# Patient Record
Sex: Male | Born: 1955
Health system: Southern US, Community
[De-identification: ages and names within clinical notes are randomized; demographics above are authoritative.]

## PROBLEM LIST (undated history)

## (undated) DIAGNOSIS — Z8249 Family history of ischemic heart disease and other diseases of the circulatory system: Secondary | ICD-10-CM

## (undated) DIAGNOSIS — E785 Hyperlipidemia, unspecified: Secondary | ICD-10-CM

## (undated) DIAGNOSIS — K759 Inflammatory liver disease, unspecified: Secondary | ICD-10-CM

## (undated) DIAGNOSIS — Z9289 Personal history of other medical treatment: Secondary | ICD-10-CM

## (undated) DIAGNOSIS — Z87898 Personal history of other specified conditions: Secondary | ICD-10-CM

## (undated) HISTORY — DX: Family history of ischemic heart disease and other diseases of the circulatory system: Z82.49

## (undated) HISTORY — DX: Personal history of other specified conditions: Z87.898

## (undated) HISTORY — PX: OTHER SURGICAL HISTORY: SHX169

## (undated) HISTORY — DX: Personal history of other medical treatment: Z92.89

## (undated) HISTORY — DX: Hyperlipidemia, unspecified: E78.5

## (undated) HISTORY — PX: TONSILLECTOMY: SUR1361

## (undated) HISTORY — DX: Inflammatory liver disease, unspecified: K75.9

---

## 1998-04-25 HISTORY — PX: NASAL SEPTOPLASTY W/ TURBINOPLASTY: SHX2070

## 1999-12-03 ENCOUNTER — Ambulatory Visit (HOSPITAL_BASED_OUTPATIENT_CLINIC_OR_DEPARTMENT_OTHER): Admission: RE | Admit: 1999-12-03 | Discharge: 1999-12-03 | Payer: Self-pay | Admitting: *Deleted

## 2004-06-10 ENCOUNTER — Ambulatory Visit: Payer: Self-pay | Admitting: Internal Medicine

## 2004-09-07 ENCOUNTER — Ambulatory Visit: Payer: Self-pay | Admitting: Internal Medicine

## 2004-09-14 ENCOUNTER — Ambulatory Visit (HOSPITAL_COMMUNITY): Admission: RE | Admit: 2004-09-14 | Discharge: 2004-09-14 | Payer: Self-pay | Admitting: Internal Medicine

## 2004-09-28 ENCOUNTER — Ambulatory Visit: Payer: Self-pay | Admitting: Internal Medicine

## 2004-10-13 ENCOUNTER — Ambulatory Visit: Payer: Self-pay | Admitting: Internal Medicine

## 2005-03-01 ENCOUNTER — Ambulatory Visit: Payer: Self-pay | Admitting: Internal Medicine

## 2005-07-11 HISTORY — PX: OTHER SURGICAL HISTORY: SHX169

## 2005-07-12 ENCOUNTER — Ambulatory Visit: Payer: Self-pay | Admitting: Internal Medicine

## 2005-08-17 ENCOUNTER — Encounter (INDEPENDENT_AMBULATORY_CARE_PROVIDER_SITE_OTHER): Payer: Self-pay | Admitting: Specialist

## 2005-08-17 ENCOUNTER — Ambulatory Visit (HOSPITAL_COMMUNITY): Admission: RE | Admit: 2005-08-17 | Discharge: 2005-08-17 | Payer: Self-pay | Admitting: Internal Medicine

## 2005-08-17 ENCOUNTER — Ambulatory Visit: Payer: Self-pay | Admitting: Internal Medicine

## 2005-10-10 ENCOUNTER — Ambulatory Visit: Payer: Self-pay | Admitting: Internal Medicine

## 2005-12-19 ENCOUNTER — Ambulatory Visit: Payer: Self-pay | Admitting: Internal Medicine

## 2006-05-18 ENCOUNTER — Ambulatory Visit: Payer: Self-pay | Admitting: Internal Medicine

## 2006-10-10 ENCOUNTER — Ambulatory Visit: Payer: Self-pay | Admitting: Internal Medicine

## 2006-10-11 ENCOUNTER — Ambulatory Visit: Payer: Self-pay | Admitting: Internal Medicine

## 2007-02-20 ENCOUNTER — Ambulatory Visit: Payer: Self-pay | Admitting: Internal Medicine

## 2007-07-06 ENCOUNTER — Ambulatory Visit: Payer: Self-pay | Admitting: Internal Medicine

## 2007-07-18 ENCOUNTER — Encounter: Payer: Self-pay | Admitting: Internal Medicine

## 2007-08-24 DIAGNOSIS — Z9289 Personal history of other medical treatment: Secondary | ICD-10-CM

## 2007-08-24 HISTORY — DX: Personal history of other medical treatment: Z92.89

## 2007-09-13 HISTORY — PX: TRANSTHORACIC ECHOCARDIOGRAM: SHX275

## 2007-10-15 DIAGNOSIS — J4520 Mild intermittent asthma, uncomplicated: Secondary | ICD-10-CM | POA: Insufficient documentation

## 2007-10-15 DIAGNOSIS — J3089 Other allergic rhinitis: Secondary | ICD-10-CM

## 2007-10-15 DIAGNOSIS — J302 Other seasonal allergic rhinitis: Secondary | ICD-10-CM | POA: Insufficient documentation

## 2007-10-16 ENCOUNTER — Ambulatory Visit: Payer: Self-pay | Admitting: Internal Medicine

## 2007-10-29 ENCOUNTER — Ambulatory Visit: Payer: Self-pay | Admitting: Internal Medicine

## 2007-10-31 ENCOUNTER — Ambulatory Visit: Payer: Self-pay | Admitting: Internal Medicine

## 2008-01-01 ENCOUNTER — Telehealth (INDEPENDENT_AMBULATORY_CARE_PROVIDER_SITE_OTHER): Payer: Self-pay | Admitting: *Deleted

## 2008-01-21 ENCOUNTER — Encounter: Payer: Self-pay | Admitting: Internal Medicine

## 2008-03-21 ENCOUNTER — Ambulatory Visit: Payer: Self-pay | Admitting: Internal Medicine

## 2008-04-28 ENCOUNTER — Ambulatory Visit: Payer: Self-pay | Admitting: Internal Medicine

## 2008-08-08 ENCOUNTER — Ambulatory Visit: Payer: Self-pay | Admitting: Internal Medicine

## 2009-01-07 ENCOUNTER — Ambulatory Visit: Payer: Self-pay | Admitting: Internal Medicine

## 2009-01-29 ENCOUNTER — Ambulatory Visit: Payer: Self-pay | Admitting: Internal Medicine

## 2009-05-10 ENCOUNTER — Encounter: Payer: Self-pay | Admitting: Internal Medicine

## 2009-05-21 ENCOUNTER — Ambulatory Visit: Payer: Self-pay | Admitting: Internal Medicine

## 2009-06-09 ENCOUNTER — Ambulatory Visit: Payer: Self-pay | Admitting: Internal Medicine

## 2009-09-30 ENCOUNTER — Ambulatory Visit: Payer: Self-pay | Admitting: Internal Medicine

## 2010-03-16 ENCOUNTER — Ambulatory Visit: Payer: Self-pay | Admitting: Internal Medicine

## 2010-05-20 ENCOUNTER — Ambulatory Visit
Admission: RE | Admit: 2010-05-20 | Discharge: 2010-05-20 | Payer: Self-pay | Source: Home / Self Care | Attending: Internal Medicine | Admitting: Internal Medicine

## 2010-05-25 NOTE — Assessment & Plan Note (Signed)
Summary: f/u ///kp   Primary Provider/Referring Provider:  Gareth Morgan  CC:  Follow up c/o increase nasal congestion has question about changing nasal spray.  History of Present Illness: ASTHMA (ICD-493.90) ALLERGIC RHINITIS (ICD-477.9)  10/29/07- 55 year old man returning for follow-up of allergic rhinitis and asthma.  Skin testing planned for today.  He would like a prescription for astepro nasal spray.  Skin test:Appropriate controls. Significant reactions especially for grass pollen, weeds, tree pollens, and dust mite.  04/28/08- Asthma, allergic rhinitis Doing well with vaccine, giving own with no problems. Not needing allegra currently. Got both flu vax. Mild chest congestion and very slow to clear winter colds, has been like this most of his life. Denies wheeze. Prone to left nare congestion with dry heat.  May 21, 2009- Asthma, allergic rhinitis He feels low grade nose and chest congestion with some nasal stuffiness. Hx of deviated septum surgery 10 years ago that helped. Tried off fexofenadine but got worse- it helps.     Current Medications (verified): 1)  Advair Diskus 250-50 Mcg/dose  Misc (Fluticasone-Salmeterol) .Marland Kitchen.. 1 Puff Two Times A Day 2)  Allegra 180 Mg  Tabs (Fexofenadine Hcl) .Marland Kitchen.. 1 By Mouth Daily 3)  Allergy Vaccine Go (W-E) 1:10 .... Advance To 1:10 Next Order 4)  Epipen 0.3 Mg/0.83ml (1:1000)  Devi (Epinephrine Hcl (Anaphylaxis)) .... As Needed 5)  Proair Hfa 108 (90 Base) Mcg/act  Aers (Albuterol Sulfate) .... 2 Puffs Four Times A Day As Needed 6)  Astepro 137 Mcg/spray  Soln (Azelastine Hcl) .Marland Kitchen.. 1-2 Spray Each Nostril Up To Twice Daily 7)  Adult Aspirin Ec Low Strength 81 Mg  Tbec (Aspirin) .Marland Kitchen.. 1 By Mouth Daily 8)  Simvastatin .Marland Kitchen.. 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    Vital Signs:  Patient profile:   55 year old male Height:      69 inches Weight:      154 pounds BMI:     22.82 O2 Sat:      97 % on Room air Pulse rate:   91 / minute BP sitting:   116 / 72  (right arm) Cuff size:   regular  Vitals Entered By: Renold Genta RCP, LPN (May 21, 2009 3:13 PM)  O2 Sat at Rest %:  97% O2 Flow:  Room air CC: Follow up c/o increase nasal congestion has question about changing nasal spray Comments Medications reviewed with patient Renold Genta RCP, LPN  May 21, 2009 3:24 PM    Physical Exam  Additional Exam:  General: A/Ox3; pleasant and cooperative, NAD, slender SKIN: no rash, lesions NODES: no lymphadenopathy HEENT: [place]/AT, EOM- WNL, Conjuctivae- clear, PERRLA, TM-WNL, Nose- clear, external nasal deviation to the right, Throat- red w/o exudate or drainage NECK: Supple w/ fair ROM, JVD- none,  normal carotid impulses w/o bruits Thyroid- normal to palpation CHEST: Clear to P&A HEART: RRR, no m/g/r heard ABDOMEN: Soft and nl;  ZOX:WRUE, nl pulses, no edema  NEURO: Grossly intact to observation      Impression & Recommendations:  Problem # 1:  ALLERGIC RHINITIS (ICD-477.9)  Astepro helps some. Nasonex and Singulair didn't help much. We introduced Neti pot concept. His  updated medication list for this problem includes:    Allegra 180 Mg Tabs (Fexofenadine hcl) .Marland Kitchen... 1 by mouth daily    Astepro 137 Mcg/spray Soln (Azelastine hcl) .Marland Kitchen... 1-2 spray each nostril up to twice daily  Problem # 2:  ASTHMA (ICD-493.90) We will try substituting Symbicort 160/4.5 for Advair for awhile. Watch for reflux- this may be causing more irritation than he realizes.  Medications Added to Medication List This Visit: 1)  Simvastatin  .Marland Kitchen.. 1 once daily  Other Orders: Est. Patient Level III (45409)  Patient Instructions: 1)  Schedule return in one year, earlier if needed 2)  Continue allergy vaccine at 1:10 3)  Fexofenadine is renewed 4)  Try the Neti pot/ Squeeze bottle approach to rinse your nasal passages. 5)  Consider trying otc nasal anti-inflammatory Nasalcrom/ cromolyn nasal spray. 6)  Try replacing Advair for a while with sample Symbicort 160/4.5 :  7)   2 puffs and rinse twice daily. 8)  Pay attention to how often you feel stomach juice reflux or acid indigestion. It may help your throat to take otc pepcid once daily.

## 2010-05-25 NOTE — Medication Information (Signed)
Summary: Advair dispensed via mail/[employer]  Advair dispensed via mail/[employer]   Imported By: Lester [place] 02/06/2008 10:04:42  _____________________________________________________________________  External Attachment:    Type:   Image     Comment:   External Document

## 2010-05-25 NOTE — Progress Notes (Signed)
Summary: clarify rx  Phone Note From Pharmacy Call back at [phone]    Caller: [facility] Call For: Kevin [name]  Summary of Call: clarify rx given on 09/02 - dosge, direction refills  INV# [id number] Initial call taken by: Eugene Gavia,  January 01, 2008 1:55 PM  Follow-up for Phone Call        [facility] wanted Kevin Harvey. to refill fexofenadine - gave it for 3 month supply. Marinus Maw  January 01, 2008 5:03 PM       Prescriptions: ALLEGRA 180 MG  TABS (FEXOFENADINE HCL) 1 by mouth daily  #90 x 3   Entered by:   Marinus Maw   Authorized by:   Pulmonary Triage   Signed by:   Marinus Maw on 01/01/2008   Method used:   Telephoned to ...       [facility]* (mail-order)             ,          Ph: [phone]       Fax: [phone]   RxID:   [id number]

## 2010-05-25 NOTE — Assessment & Plan Note (Signed)
Summary: rov 6 months////kp   PCP:  Gareth Morgan  Chief Complaint:  6 month follow up visit .  History of Present Illness: Current Problems:  ASTHMA (ICD-493.90) ALLERGIC RHINITIS (ICD-477.9)  10/29/07- 55 year old man returning for follow-up of allergic rhinitis and asthma.  Skin testing planned for today.  He would like a prescription for astepro nasal spray.  Skin test:Appropriate controls. Significant reactions especially for grass pollen, weeds, tree pollens, and dust mite.  04/28/08- Asthma, allergic rhinitis Doing well with vaccine, giving own with no problems. Not needing allegra currently. Got both flu vax. Mild chest congestion and very slow to clear winter colds, has been like this most of his life. Denies wheeze. Prone to left nare congestion with dry heat.         Prior Medication List:  ADVAIR DISKUS 250-50 MCG/DOSE  MISC (FLUTICASONE-SALMETEROL) 1 puff two times a day ALLEGRA 180 MG  TABS (FEXOFENADINE HCL) 1 by mouth daily * ALLERGY VACCINE  EPIPEN 0.3 MG/0.3ML (1:1000)  DEVI (EPINEPHRINE HCL (ANAPHYLAXIS)) as needed PROAIR HFA 108 (90 BASE) MCG/ACT  AERS (ALBUTEROL SULFATE) 2 puffs four times a day as needed ASTEPRO 137 MCG/SPRAY  SOLN (AZELASTINE HCL) 1-2 spray each nostril up to twice daily VYTORIN 10-20 MG  TABS (EZETIMIBE-SIMVASTATIN) 1 by mouth daily ADULT ASPIRIN EC LOW STRENGTH 81 MG  TBEC (ASPIRIN) 1 by mouth daily   Updated Prior Medication List: ADVAIR DISKUS 250-50 MCG/DOSE  MISC (FLUTICASONE-SALMETEROL) 1 puff two times a day ALLEGRA 180 MG  TABS (FEXOFENADINE HCL) 1 by mouth daily VYTORIN 10-20 MG  TABS (EZETIMIBE-SIMVASTATIN) 1 by mouth daily ADULT ASPIRIN EC LOW STRENGTH 81 MG  TBEC (ASPIRIN) 1 by mouth daily * ALLERGY VACCINE  EPIPEN 0.3 MG/0.3ML (1:1000)  DEVI (EPINEPHRINE HCL (ANAPHYLAXIS)) as needed PROAIR HFA 108 (90 BASE) MCG/ACT  AERS (ALBUTEROL SULFATE) 2 puffs four times a day as needed ASTEPRO 137 MCG/SPRAY  SOLN (AZELASTINE HCL)  1-2 spray each nostril up to twice daily  Current Allergies (reviewed today): No known allergies   Past Medical History:    Reviewed history from 10/15/2007 and no changes required:       Allergic Rhinitis       Asthma       Hepatitis in 3rd grade  Past Surgical History:    nasal septoplasty    tonsils    varicocoel   Social History:    Reviewed history from 10/16/2007 and no changes required:       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       Never smoked or ETOH use       Married with 2 children    Review of Systems      See HPI   Vital Signs:  Patient Profile:   55 Years Old Male Weight:      152.13 pounds O2 Sat:      98 % O2 treatment:    Room Air Pulse rate:   85 / minute BP sitting:   106 / 68  (left arm) Cuff size:   regular  Vitals Entered By: Reynaldo Minium CMA (April 28, 2008 4:26 PM)             Comments Medications reviewed with patient Reynaldo Minium CMA  April 28, 2008 4:27 PM      Physical Exam  General: A/Ox3; pleasant and cooperative, NAD, SKIN: no rash, lesions NODES: no lymphadenopathy HEENT: [place]/AT, EOM- WNL, Conjuctivae- clear, PERRLA, TM-WNL, Nose- clear, external nasal deviation to the right, Throat- clear  and wnl NECK: Supple w/ fair ROM, JVD- none, normal carotid impulses w/o bruits Thyroid- normal to palpation CHEST: Clear to P&A HEART: RRR, no m/g/r heard ABDOMEN: Soft and nl; nml bowel sounds; no organomegaly or masses noted EAV:WUJW, nl pulses, no edema  NEURO: Grossly intact to observation         Problem # 1:  ALLERGIC RHINITIS (ICD-477.9) Good control now. Allergy vaccine is well tolerated. His updated medication list for this problem includes:    Allegra 180 Mg Tabs (Fexofenadine hcl) .Marland Kitchen... 1 by mouth daily    Astepro 137 Mcg/spray Soln (Azelastine hcl) .Marland Kitchen... 1-2 spray each nostril up to twice daily   Problem # 2:  ASTHMA (ICD-493.90) Satisfactory now. has avoided winter colds so far. His updated medication list for this  problem includes:    Advair Diskus 250-50 Mcg/dose Misc (Fluticasone-salmeterol) .Marland Kitchen... 1 puff two times a day    Proair Hfa 108 (90 Base) Mcg/act Aers (Albuterol sulfate) .Marland Kitchen... 2 puffs four times a day as needed   Medications Added to Medication List This Visit: 1)  Allergy Vaccine Go (w-e)  .... Advance to 1:10 next order   Patient Instructions: 1)  Please schedule a follow-up appointment in 1 year. 2)  continue allergy vaccine- We will advance vaccine strength to 1:10 next time you order. 3)  Try otc nasal saline gel fot the dry nose as needed.   Prescriptions: ALLERGY VACCINE GO (W-E) Advance to 1:10 NEXT ORDER  #1 x prn   Entered and Authorized by:   Waymon Budge MD   Signed by:   Waymon Budge MD on 05/10/2008   Method used:   Print then Give to Patient   RxID:   [id number]  ]

## 2010-05-25 NOTE — Assessment & Plan Note (Signed)
Summary: FLU SHOT/[facility]  Nurse Visit   Allergies: No Known Drug Allergies  Orders Added: 1)  Admin 1st Vaccine [90471] 2)  Flu Vaccine 75yrs + [09811] Flu Vaccine Consent Questions     Do you have a history of severe allergic reactions to this vaccine? no    Any prior history of allergic reactions to egg and/or gelatin? no    Do you have a sensitivity to the preservative Thimersol? no    Do you have a past history of Guillan-Barre Syndrome? no    Do you currently have an acute febrile illness? no    Have you ever had a severe reaction to latex? no    Vaccine information given and explained to patient? yes    Are you currently pregnant? no    Lot Number:[id number]   Exp Date:10/22/2009   Site Given  Right Deltoid IM   Tammy Scott  January 29, 2009 10:50 AM

## 2010-05-25 NOTE — Assessment & Plan Note (Signed)
Summary: 1 yr rov////kp   Visit Type:  Follow-up PCP:  Gareth Morgan  Chief Complaint:  yearly follow up visit.  History of Present Illness: Current Problems:  ASTHMA (ICD-493.90) ALLERGIC RHINITIS (ICD-477.9)  55 yo man returns for follow up of allergic rhinitis and asthma. He continues giving his own allergy vaccine at 1:10 with no problems.  He had his wife had a cough with chest congestion for 3 weeks, now resolving.  He complains of the humidity and complains of working in an old building.  He is sensitive tothe odor of newsprint and newspaper.  He snores and his wife is in a separate room.  A sleep study years ago found him borderline for diagnosis of sleep apnea.  The methacholine inhalation challenge test was negative in 2006. Denies headache, sinus drainage, sneezing chest pain, dyspnea, n/v/d, weight loss, fever, edema.         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       Never smoked or ETOH use       Married with 2 children    Review of Systems      See HPI   Vital Signs:  Patient Profile:   55 Years Old Male Weight:      156 pounds O2 Sat:      97 % O2 treatment:    Room Air Pulse rate:   94 / minute BP sitting:   106 / 60  (right arm) Cuff size:   regular  Vitals Entered By: Reynaldo Minium CMA (October 16, 2007 2:02 PM)             Comments Medications reviewed with patient Reynaldo Minium CMA  October 16, 2007 2:02 PM      Physical Exam  GENERAL:  A/Ox3; pleasant & cooperative.NAD, lean HEENT:  [place]/AT,  EOM-wnl, PERRLA, EACs-clear, TMs-wnl, NOSE-externalnose and septum are deviated., THROAT-clear & wnl. NECK:  Supple w/ fair ROM; no JVD; normal carotid impulses w/o bruits; no thyromegaly or nodules palpated; no lymphadenopathy. CHEST: Clear to P&A HEART:  RRR, no m/r/g  heard ABDOMEN:  Soft & nt; nml bowel sounds; no organomegaly or masses detected. EXT: Warm bilat,  no calf pain, edema, clubbing, pulses intact Skin: no rash/lesion       Problem # 1:  ASTHMA (ICD-493.90) recent asthma exacerbation, shared with his wife sounds like a viral bronchitis syndrome.  Snoring reflects old traumatic deviation of his nose.  He does want clarification of his allergy sensitivity status andI  agreed to bring him back for retesting.  I offered referral for ENT evaluation to repair his nasal anatomy, wants to wait.  Plan: Allergy retest, sample astepro. His updated medication list for this problem includes:    Advair Diskus 250-50 Mcg/dose Misc (Fluticasone-salmeterol) .Marland Kitchen... 1 puff two times a day    Proair Hfa 108 (90 Base) Mcg/act Aers (Albuterol sulfate) .Marland Kitchen... 2 puffs four times a day as needed  Problem # 2:  ALLERGIC RHINITIS (ICD-477.9) plan allergy retest and sample astepro as discussed above.  We reviewed environmental precautions again. His updated medication list for this problem includes:    Allegra 180 Mg Tabs (Fexofenadine hcl) .Marland Kitchen... 1 by mouth daily   Medications Added to Medication List This Visit: 1)  Proair Hfa 108 (90 Base) Mcg/act Aers (Albuterol sulfate) .... 2 puffs four times a day as needed   Patient Instructions: 1)  Return as able for allergy skin testing- 2)  Need to be off antihistamines, including Allegra and the sample of Astepro nasal spray, for 3 days before you return.   Prescriptions: PROAIR HFA 108 (90 BASE) MCG/ACT  AERS (ALBUTEROL SULFATE) 2 puffs four times a day as needed  #3 x 3   Entered and Authorized by:   Waymon Budge MD   Signed by:   Waymon Budge MD on 10/16/2007   Method used:   Print then Give to Patient   RxID:   [id number] ADVAIR DISKUS 250-50 MCG/DOSE  MISC (FLUTICASONE-SALMETEROL) 1 puff two times a day  #3 x 3   Entered and Authorized by:   Waymon Budge MD   Signed by:   Waymon Budge MD on 10/16/2007   Method used:   Print then Give to Patient   RxID:   [id number]  ]

## 2010-05-25 NOTE — Medication Information (Signed)
Summary: ProAir/[facility]  [facility]Air/[facility]   Imported By: Sherian Rein 05/13/2009 12:03:28  _____________________________________________________________________  External Attachment:    Type:   Image     Comment:   External Document

## 2010-05-25 NOTE — Assessment & Plan Note (Signed)
Summary: ALLERGY TEST/[facility]   Vital Signs:  Patient Profile:   55 Years Old Male Weight:      153.38 pounds O2 Sat:      99 % O2 treatment:    Room Air Pulse rate:   77 / minute BP sitting:   110 / 66  (left arm) Cuff size:   regular  Vitals Entered By: Kevin [name] CMA (October 29, 2007 3:17 PM)                 PCP:  Kevin [name]  Chief Complaint:  allergy testing.  History of Present Illness: Current Problems:  ASTHMA (ICD-493.90) ALLERGIC RHINITIS (ICD-57.43)  55 year old man returning for follow-up of allergic rhinitis and asthma.  Skin testing planned for today.  He would like a prescription for astepro nasal spray.  Skin test:Appropriate controls. Significant reactions especially for grass pollen, weeds, tree pollens, and dust mite.      Prior Medications Reviewed Using: Patient Recall  Updated Prior Medication List: ADVAIR DISKUS 250-50 MCG/DOSE  MISC (FLUTICASONE-SALMETEROL) 1 puff two times a day ALLEGRA 180 MG  TABS (FEXOFENADINE HCL) 1 by mouth daily VYTORIN 10-20 MG  TABS (EZETIMIBE-SIMVASTATIN) 1 by mouth daily ADULT ASPIRIN EC LOW STRENGTH 81 MG  TBEC (ASPIRIN) 1 by mouth daily * ALLERGY VACCINE  EPIPEN 0.3 MG/0.3ML (1:1000)  DEVI (EPINEPHRINE HCL (ANAPHYLAXIS)) as needed PROAIR HFA 108 (90 BASE) MCG/ACT  AERS (ALBUTEROL SULFATE) 2 puffs four times a day as needed ASTEPRO 137 MCG/SPRAY  SOLN (AZELASTINE HCL) 1-2 spray each nostril up to twice daily  Current Allergies (reviewed today): No known allergies   Past Medical History:    Reviewed history from 10/15/2007 and no changes required:       Allergic Rhinitis       Asthma   Social History:    Reviewed history from 10/16/2007 and no changes required:       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     Physical Exam  GENERAL:   A/Ox3; pleasant & cooperative.NAD, thin HEENT:  [place]/AT, EOM-wnl, PERRLA, EACs-clear, TMs-wnl, NOSE-clear, THROAT-clear & wnl. NECK:  Supple w/ fair ROM; no JVD; normal carotid impulses w/o bruits; no thyromegaly or nodules palpated; no lymphadenopathy. CHEST: Clear to P&A HEART:  RRR, no m/r/g  heard ABDOMEN:  Soft & nt; nml bowel sounds; no organomegaly or masses detected. EXT: Warm bilat,  no calf pain, edema, clubbing, pulses intact Skin: no rash/lesion      Problem # 1:  ASTHMA (ICD-493.90) allergic rhinitis and asthma.  Current skin test profile is not significantly different from his vaccine so we are not going to change.  He will continue at 1:10.  Education discussion again about symptomatic therapy, and environmental precautions, as well as risk-benefit considerations of allergy vaccine.  Medications Added to Medication List This Visit: 1)  Astepro 137 Mcg/spray Soln (Azelastine hcl) .Marland Kitchen.. 1-2 spray each nostril up to twice daily   Patient Instructions: 1)  Please schedule a follow-up appointment in 6 months.   Prescriptions: ASTEPRO 137 MCG/SPRAY  SOLN (AZELASTINE HCL) 1-2 spray each nostril up to twice daily  #3 x 3   Entered by:   Kevin [name] CMA   Authorized by:  Kevin Budge MD   Signed by:   Kevin [name] CMA on 10/29/2007   Method used:   Print then Give to Patient   RxID:   [id number]  ]

## 2010-05-25 NOTE — Miscellaneous (Signed)
Summary: Routine Allergy Skin Test/[facility]  Routine Allergy Skin Test/[facility]   Imported By: Esmeralda Links D'jimraou 11/05/2007 13:25:39  _____________________________________________________________________  External Attachment:    Type:   Image     Comment:   External Document

## 2010-05-25 NOTE — Medication Information (Signed)
Summary: Refill Advair Diskus/[facility]  Refill Advair Diskus/[facility]   Imported By: Maryln Gottron 07/20/2007 11:33:24  _____________________________________________________________________  External Attachment:    Type:   Image     Comment:   External Document

## 2010-05-25 NOTE — Medication Information (Signed)
Summary: Fexofenadine/[facility]  Fexofenadine/[facility]   Imported By: Sherian Rein 05/26/2009 07:50:22  _____________________________________________________________________  External Attachment:    Type:   Image     Comment:   External Document

## 2010-06-02 NOTE — Assessment & Plan Note (Signed)
Summary: rov 1 yr ///kp   Primary Provider/Referring Provider:  Gareth Morgan   History of Present Illness: 10/29/07- 55 year old man returning for follow-up of allergic rhinitis and asthma.  Skin testing planned for today.  He would like a prescription for astepro nasal spray.  Skin test:Appropriate controls. Significant reactions especially for grass pollen, weeds, tree pollens, and dust mite.  04/28/08- Asthma, allergic rhinitis Doing well with vaccine, giving own with no problems. Not needing allegra currently. Got both flu vax. Mild chest congestion and very slow to clear winter colds, has been like this most of his life. Denies wheeze. Prone to left nare congestion with dry heat.  May 21, 2009- Asthma, allergic rhinitis He feels low grade nose and chest congestion with some nasal stuffiness. Hx of deviated septum surgery 10 years ago that helped. Tried off fexofenadine but got worse- it helps.   May 20, 2010-  Asthma, allergic rhinitis Acute bronchitis- not much wheeze. Moisute in his office building makes him hoarse. Had cold 2 weeks ago. No throat clearing but not sore throat or discolored phlegm. Left side of nose routinely stops up easily despite remote hx of septoplasty. He continues allergy vaccine at 1:10, believing it helps, with no problems. Now uses hearing aides but denies ear pain or pressure.  Discussed Neti pot. Using sudafed +/- allegra    Preventive Screening-Counseling & Management  Alcohol-Tobacco     Smoking Status: never  Allergies: No Known Drug Allergies  Past History:  Past Medical History: Last updated: 04/28/2008 Allergic Rhinitis Asthma Hepatitis in 3rd grade  Past Surgical History: Last updated: 04/28/2008 nasal septoplasty tonsils varicocoel  Family History: Last updated: 10/16/2007 Mother-living age 58; no medical problems Father- deceased age 38; CHF, Influenza 2 Brothers-living age 30 and 96; heart attack for  both Sister- living age 69; no medical problems  Social History: Last updated: 10/16/2007 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  The patient denies shortness of breath with activity, shortness of breath at rest, productive cough, coughing up blood, chest pain, irregular heartbeats, acid heartburn, indigestion, loss of appetite, weight change, abdominal pain, difficulty swallowing, sore throat, tooth/dental problems, headaches, sneezing, change in color of mucus, and fever.    Physical Exam  Additional Exam:  General: A/Ox3; pleasant and cooperative, NAD, slender,throat clearing SKIN: no rash, lesions NODES: no lymphadenopathy HEENT: [place]/AT, EOM- WNL, Conjuctivae- clear, PERRLA, TM-WNL, Nose- clear, external nasal deviation to the right, Throat- red w/o exudate or drainage NECK: Supple w/ fair ROM, JVD- none, normal carotid impulses w/o bruits Thyroid- normal to palpation CHEST: Clear to P&A HEART: RRR, no m/g/r heard ABDOMEN: Soft and nl;  VWU:JWJX, nl pulses, no edema  NEURO: Grossly intact to observation      Impression & Recommendations:  Problem # 1:  ALLERGIC RHINITIS (ICD-477.9)  Adequate long term control. We reviewed allergy vaccine status and adjustments available as Spring season starts.  Some concern about what he thinks is a sick office building with moisture problem.  Now has an incidental URI and we discussed viral vs allergic patterns. neti pot would be worth a try. He chosses to wait on taking any more meds.  His updated medication list for this problem includes:  Allegra 180 Mg Tabs (Fexofenadine hcl) .Marland Kitchen... 1 by mouth daily    Astepro 137 Mcg/spray Soln (Azelastine hcl) .Marland Kitchen... 1-2 spray each  nostril up to twice daily  Problem # 2:  ASTHMA (ICD-493.90) Minimal intermittent astma, well enough controlled.   Other Orders: Est. Patient Level III (16109)  Patient Instructions: 1)  Please schedule a follow-up appointment in 1 year. 2)  Consider trying a Neti pot for nasal congestion 3)  Continue allergy vaccine at 1:10

## 2010-06-08 ENCOUNTER — Telehealth: Payer: Self-pay | Admitting: Internal Medicine

## 2010-06-16 NOTE — Progress Notes (Signed)
  Phone Note Refill Request Message from:  Fax from Pharmacy  Refills Requested: Medication #1:  PROAIR HFA 108 (90 BASE) MCG/ACT  AERS 2 puffs four times a day as needed Initial call taken by: Zackery Barefoot CMA,  June 08, 2010 9:20 AM    Prescriptions: PROAIR HFA 108 (90 BASE) MCG/ACT  AERS (ALBUTEROL SULFATE) 2 puffs four times a day as needed  #3 x 3   Entered by:   Zackery Barefoot CMA   Authorized by:   Waymon Budge MD   Signed by:   Zackery Barefoot CMA on 06/08/2010   Method used:   Faxed to ...       [facility] (mail-order)       [address] [place], [place]  [address]       Ph: [phone]       Fax: [phone]   RxID:   [id number]

## 2010-08-12 ENCOUNTER — Ambulatory Visit (INDEPENDENT_AMBULATORY_CARE_PROVIDER_SITE_OTHER): Payer: Federal, State, Local not specified - PPO

## 2010-08-12 DIAGNOSIS — J309 Allergic rhinitis, unspecified: Secondary | ICD-10-CM

## 2010-09-07 NOTE — Assessment & Plan Note (Signed)
[facility]                             PULMONARY OFFICE NOTE   NAME:Kevin [name], Kevin [name]                        MRN:          [id number]  DATE:10/10/2006                            DOB:          Aug 08, 1955    PROBLEM:  1. Asthma.  2. Allergic rhinitis.   HISTORY:  He has done quite well.  He stopped Singulair because he saw  no benefit.  The orange air quality days definitely bother him, but he  has been otherwise okay through the spring.  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  He is not much bothered  by nasal congestion lying down on most days and has been using a nasal  saline spray.  There had been some question of sleep-disordered  breathing and he had a sleep study at [facility],  which he understands showed no significant problems.   MEDICATIONS:  1. Advair 250/50.  2. Allegra 180.  3. Allergy vaccine at 1:10 with no problems.  4. Vytorin 10/20.  5. Aspirin 81 mg.  6. P.r.n. use of an albuterol inhaler.  7. He has an EpiPen.   ALLERGIES:  No medication allergy.   OBJECTIVE:  Weight 151 pounds, blood pressure 116/70, pulse 85, room air  saturation 98%.  Mild hoarseness.  There is external deviation of his nose and septum  towards the right, although he gives history of a septoplasty.  Tympanic  membranes are normal.  Voice quality is normal with no postnasal drip or  stridor.  LUNGS:  Clear.  HEART:  Sounds normal.   IMPRESSION:  1. Asthmatic bronchitis with history of negative methacholine study.  2. Rhinitis.   PLAN:  1. We discussed saline gel, nasal strips and Nasalcrom nose spray.  2. We refilled his EpiPen and his inhaled medications.  3. Scheduled return in 1 year, earlier p.r.n.  4. Continue allergy vaccine at 1:10.     Clinton D. Maple Hudson, MD, Tonny Bollman, FACP  Electronically Signed    CDY/MedQ  DD:  10/10/2006  DT: 10/11/2006  Job #: [id number]   cc:   Mila Homer. Sudie Bailey, M.D.

## 2010-09-10 NOTE — Op Note (Signed)
NAME:  Kevin [name], Kevin [name]                 ACCOUNT NO.:  [id number]   MEDICAL RECORD NO.:  [id number]          PATIENT TYPE:  AMB   LOCATION:  DAY                           FACILITY:  [facility]   PHYSICIAN:  R. Roetta Sessions, M.D. DATE OF BIRTH:  02-02-1956   DATE OF PROCEDURE:  08/17/2005  DATE OF DISCHARGE:                                 OPERATIVE REPORT   PROCEDURE:  Colonoscopy with snare polypectomy.   INDICATIONS FOR PROCEDURE:  The patient is a 55 year old gentleman devoid of  any lower GI tract symptoms, sent under the courtesy of Dr. __________ for  colorectal cancer screening.  He has never had his lower GI tract imaged.  There is no family history of colorectal neoplasia.  Colonoscopy is now to  be done. The proposed test was discussed with patient including the  potential risks, benefits and alternatives of the exam, and the questions  answered.  Please see documentation in medical record procedure note O2  saturation, blood pressure, pulse, respirations monitored throughout the  entire procedure.   CONSCIOUS SEDATION:  Versed 3 mg IV, Demerol 75 mg IV in divided doses.  Because the patient states he does take antibiotics for his heart with  invasive procedures, we gave him ampicillin 2 grams IV and gentamicin 105 mg  IV prior to the procedure.   INSTRUMENTATION:  Olympus video chip system.   FINDINGS:  Digital rectal exam revealed no abnormalities.   ENDOSCOPIC FINDINGS:  Prep was good.  Rectum and colon were examined  including rectum via retroflexion to the see the anal verge revealed some  internal hemorrhoids and a 1.5 x 1 cm flat polyp that came right down to the  anal verge.  Please see photos.  The rectal mucosa otherwise appeared  normal.  The colonic mucosa was surveyed from the rectosigmoid junction to  the left transverse right colon, identifying the appendiceal orifice,  ileocecal valve and cecum.  These structures were well seen and photographed  for the record.   The Olympus endoscope was slowly withdrawn and all  previously viewed surfaces were again seen.  The patient was noted to have  scattered left- sided diverticula and colonic mucosa appeared normal.  The  rectal polyp was removed with two passes of the snare in the retroflexed  position.  The polyp was recovered.  The patient tolerated the procedure  well and was reactive in endoscopy suite.   ENDOSCOPIC IMPRESSIONS:  Internal hemorrhoids, flat polyp in distal rectum,  resected as described above, otherwise normal rectum.  Left sided  diverticula.  The remainder of colonic mucosa appeared normal.   RECOMMENDATIONS:  1.  Diverticulosis literature provided to Mr. Smelcer.  2.  No aspirin or arthritis medications for 10 days.  3.  Follow up on pathology.  4.  Further recommendations to follow.      Jonathon Bellows, M.D.  Electronically Signed     RMR/MEDQ  D:  08/17/2005  T:  08/18/2005  Job:  [id number]   cc:   Dr. Brett Canales __________

## 2010-09-10 NOTE — Op Note (Signed)
[facility]  Patient:    Kevin [name], Kevin [name]                        MRN: [id number] Proc. Date: 12/03/99 Adm. Date:  [id number] Attending:  Aundria Mems                           Operative Report  PREOPERATIVE DIAGNOSES: 1. Deviated nasal septum. 2. Hyperplastic inferior nasal turbinates.  POSTOPERATIVE DIAGNOSES: 1. Deviated nasal septum. 2. Hyperplastic inferior nasal turbinates.  PROCEDURE: 1. Nasal septoplasty. 2. Submucous resection bilateral inferior nasal turbinates.  DESCRIPTION OF PROCEDURE:  With the patient under general endotracheal anesthesia the face was prepped and draped in a sterile fashion. Examination of the nose revealed that the patient had an anterior maxillary crest spur of mild to moderate degrees towards the left and progressing posteriorly into a very sharp prominent vomerine spur abutting into the lateral nasal wall. The patient had prominent middle and inferior turbinates right somewhat more so than left in a compensatory fashion.  Nasal block anesthesia was applied with olive tipped probes soaked with 4% Xylocaine with ephedrine solution applied to the sphenopalatine and anterior ethmoid nerve areas bilaterally.  The nasal columella and septum was infiltrated with 1% Xylocaine with 1:100,000 epinephrine.  Nasal cotton pledgets were placed bilaterally also soaked in 4% Xylocaine with 1:100,000 epinephrine.  The inferior turbinates were infiltrated later in the procedure with 1% Xylocaine with 1:100,000 epinephrine all for vasocontriction.  A caudal incision was made inside the left nasal vestibule.  Mucosal flap was elevated off the cartilaginous bony septum on the left side encountering the expected abnormalities of deflection of the inferior bilateral cartilage and maxillary crest to the left and far more posteriorly the large prominent 4+ abutting vomerine spur to the left.  The flaps were elevated off the  patients right septum as well.  The posteroinferior quadrilateral cartilage, maxillary crest and vomerine spur were all removed giving visible airway into the nasal pharynx into the left side.  Once this was completed he had moderate deflection of the perpendicular ethmoid plate so the quadrilateral cartilage was separated from the perpendicular ethmoid plate posterosuperiorly and the deflected ethmoid plate removed.  This brought the patients septum into a straight midline position anterior to posterior with clear visible airways. He had large prominent inferior turbinates and middle turbinates bilaterally.  A stab incision was made at the anterior aspect of the left inferior turbinate, superiorly based septal surface mucosal flap was elevated off the large heavy turbinate bone.  The lower 2/3rds of the turbinate bone with attached inferior meatal and free margin mucosa was sharply excised with angled scissors removing the majority of the posterior extent of the turbinate.  Hemostasis was obtained with suction cautery along the bony and mucosal margins.  The remaining septal bone was outfractured.  The prominent and middle turbinate on the left side was gently outfractured.  The right inferior turbinate was reduced in a similar fashion.  The right middle turbinate being quite prominent was crushed probably containing a conchal bullosa cell and gently outfractured.  The septal incision was then closed with interrupted sutures of 4-0 chromic cat gut sutures.  The patients nose was then packed with Vaseline gauze impregnated with Cortisporin ointment inserting a 6.5 French nasal trumpet airways bilaterally at the patients request.  The nose was packed completely and carefully around the anterior aspect of the  nasal trumpets protecting any contact from the nasal vestibular skin.  The nasal trumpets were also stabilized in position with 3-0 ______-0 chromic cat gut suture anteriorly.  The  patients oropharynx and nasopharynx was suctioned clear at the end of the case.  Blood loss in a suction catheter significantly less than 100 cc at the completion of the case.  The patient tolerated the procedure well and was taken to recovery area in stable general condition. DD:  12/03/99 TD:  12/04/99 Job: [id number]

## 2010-11-29 ENCOUNTER — Ambulatory Visit (INDEPENDENT_AMBULATORY_CARE_PROVIDER_SITE_OTHER): Payer: Self-pay

## 2010-11-29 DIAGNOSIS — J309 Allergic rhinitis, unspecified: Secondary | ICD-10-CM

## 2010-12-21 ENCOUNTER — Other Ambulatory Visit: Payer: Self-pay | Admitting: Internal Medicine

## 2010-12-21 MED ORDER — FLUTICASONE-SALMETEROL 250-50 MCG/DOSE IN AEPB: 1.0000 | INHALATION_SPRAY | Freq: Two times a day (BID) | RESPIRATORY_TRACT | Status: DC

## 2011-01-12 ENCOUNTER — Other Ambulatory Visit: Payer: Self-pay | Admitting: *Deleted

## 2011-01-12 MED ORDER — FLUTICASONE-SALMETEROL 250-50 MCG/DOSE IN AEPB: 1.0000 | INHALATION_SPRAY | Freq: Two times a day (BID) | RESPIRATORY_TRACT | Status: DC

## 2011-05-20 ENCOUNTER — Encounter: Payer: Self-pay | Admitting: Internal Medicine

## 2011-05-23 ENCOUNTER — Encounter: Payer: Self-pay | Admitting: Internal Medicine

## 2011-05-23 ENCOUNTER — Ambulatory Visit (INDEPENDENT_AMBULATORY_CARE_PROVIDER_SITE_OTHER): Payer: Federal, State, Local not specified - PPO | Admitting: Internal Medicine

## 2011-05-23 VITALS — BP 110/72 | HR 95 | Ht 69.0 in | Wt 150.6 lb

## 2011-05-23 DIAGNOSIS — J45909 Unspecified asthma, uncomplicated: Secondary | ICD-10-CM

## 2011-05-23 DIAGNOSIS — J309 Allergic rhinitis, unspecified: Secondary | ICD-10-CM

## 2011-05-23 MED ORDER — EPINEPHRINE 0.3 MG/0.3ML IJ DEVI: INTRAMUSCULAR | Status: AC

## 2011-05-23 NOTE — Assessment & Plan Note (Signed)
He has done well with allergy vaccine. Reminds me of history of septoplasty. He does not remember trauma although his nose is externally deviated to the right without obvious obstruction. He has an incidental cold today which should clear without treatment.

## 2011-05-23 NOTE — Progress Notes (Signed)
05/23/11- 55 yoM  Never smoker followed for asthma, allergic rhinitis LOV- 05/20/10 He recently took a retirement package offer. 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 He has noticed that on vacations, away from that building, nasal congestion would improve. He continues allergy vaccine without problems. Has had flu and pneumococcal vaccines. Now has an incidental cold with clear mucus, some cough, treated OTC.  ROS-see HPI Constitutional:   No-   weight loss, night sweats, fevers, chills, fatigue, lassitude. HEENT:   No-  headaches, difficulty swallowing, tooth/dental problems, sore throat,       No-  sneezing, itching, ear ache,+ nasal congestion, post nasal drip,  CV:  No-   chest pain, orthopnea, PND, swelling in lower extremities, anasarca,  dizziness, palpitations Resp: No-   shortness of breath with exertion or at rest.              +  productive cough,  No non-productive cough,  No- coughing up of blood.              No-   change in color of mucus.  No- wheezing.   Skin: No-   rash or lesions. GI:  No-   heartburn, indigestion, abdominal pain, nausea, vomiting, diarrhea,                 change in bowel habits, loss of appetite GU:  MS:  No-   joint pain or swelling.  No- decreased range of motion.  No- back pain. Neuro-     nothing unusual Psych:  No- change in mood or affect. No depression or anxiety.  No memory loss.   OBJ- Physical Exam General- Alert, Oriented, Affect-appropriate, Distress- none acute Skin- rash-none, lesions- none, excoriation- none Lymphadenopathy- none Head- atraumatic            Eyes- Gross vision intact, PERRLA, conjunctivae and secretions clear            Ears- Hearing aid right ear            Nose- Clear, no-Septal dev, mucus, polyps, erosion, perforation,  External nose is deviated to right            Throat- Mallampati II , mucosa clear , drainage- none, tonsils- atrophic Neck- flexible , trachea midline, no  stridor , thyroid nl, carotid no bruit Chest - symmetrical excursion , unlabored           Heart/CV- RRR , no murmur , no gallop  , no rub, nl s1 s2                           - JVD- none , edema- none, stasis changes- none, varices- none           Lung- coarse breath sounds, wheeze- none, cough- none , dullness-none, rub- none           Chest wall-  Abd-  Br/ Gen/ Rectal- Not done, not indicated Extrem- cyanosis- none, clubbing, none, atrophy- none, strength- nl Neuro- grossly intact to observation

## 2011-05-23 NOTE — Assessment & Plan Note (Signed)
Well controlled. No exacerbation despite his current cold.

## 2011-05-23 NOTE — Patient Instructions (Signed)
Epipen refilled  Continue present treatment  - Please call as needed

## 2011-05-24 ENCOUNTER — Ambulatory Visit (INDEPENDENT_AMBULATORY_CARE_PROVIDER_SITE_OTHER): Payer: Federal, State, Local not specified - PPO

## 2011-05-24 DIAGNOSIS — J309 Allergic rhinitis, unspecified: Secondary | ICD-10-CM

## 2011-09-27 ENCOUNTER — Ambulatory Visit (INDEPENDENT_AMBULATORY_CARE_PROVIDER_SITE_OTHER): Payer: Federal, State, Local not specified - PPO

## 2011-09-27 DIAGNOSIS — J309 Allergic rhinitis, unspecified: Secondary | ICD-10-CM

## 2011-10-18 ENCOUNTER — Other Ambulatory Visit: Payer: Self-pay | Admitting: Internal Medicine

## 2012-01-04 ENCOUNTER — Other Ambulatory Visit: Payer: Self-pay | Admitting: Internal Medicine

## 2012-02-22 ENCOUNTER — Ambulatory Visit (INDEPENDENT_AMBULATORY_CARE_PROVIDER_SITE_OTHER): Payer: Federal, State, Local not specified - PPO

## 2012-02-22 DIAGNOSIS — J309 Allergic rhinitis, unspecified: Secondary | ICD-10-CM

## 2012-04-19 ENCOUNTER — Other Ambulatory Visit: Payer: Self-pay | Admitting: *Deleted

## 2012-04-19 MED ORDER — FLUTICASONE-SALMETEROL 250-50 MCG/DOSE IN AEPB: 1.0000 | INHALATION_SPRAY | Freq: Two times a day (BID) | RESPIRATORY_TRACT | Status: DC

## 2012-04-26 ENCOUNTER — Ambulatory Visit (INDEPENDENT_AMBULATORY_CARE_PROVIDER_SITE_OTHER): Payer: Federal, State, Local not specified - PPO

## 2012-04-26 DIAGNOSIS — J309 Allergic rhinitis, unspecified: Secondary | ICD-10-CM

## 2012-05-04 ENCOUNTER — Other Ambulatory Visit: Payer: Self-pay | Admitting: Internal Medicine

## 2012-05-04 MED ORDER — FLUTICASONE-SALMETEROL 250-50 MCG/DOSE IN AEPB: 1.0000 | INHALATION_SPRAY | Freq: Two times a day (BID) | RESPIRATORY_TRACT | Status: DC

## 2012-05-22 ENCOUNTER — Ambulatory Visit: Payer: Federal, State, Local not specified - PPO | Admitting: Internal Medicine

## 2012-06-14 ENCOUNTER — Encounter: Payer: Self-pay | Admitting: Internal Medicine

## 2012-06-14 ENCOUNTER — Ambulatory Visit (INDEPENDENT_AMBULATORY_CARE_PROVIDER_SITE_OTHER): Payer: Federal, State, Local not specified - PPO | Admitting: Internal Medicine

## 2012-06-14 VITALS — BP 132/78 | HR 78 | Ht 69.0 in | Wt 151.8 lb

## 2012-06-14 DIAGNOSIS — J309 Allergic rhinitis, unspecified: Secondary | ICD-10-CM

## 2012-06-14 DIAGNOSIS — J302 Other seasonal allergic rhinitis: Secondary | ICD-10-CM

## 2012-06-14 DIAGNOSIS — J45909 Unspecified asthma, uncomplicated: Secondary | ICD-10-CM

## 2012-06-14 DIAGNOSIS — J4520 Mild intermittent asthma, uncomplicated: Secondary | ICD-10-CM

## 2012-06-14 MED ORDER — FLUTICASONE-SALMETEROL 250-50 MCG/DOSE IN AEPB: 1.0000 | INHALATION_SPRAY | Freq: Two times a day (BID) | RESPIRATORY_TRACT | Status: DC

## 2012-06-14 MED ORDER — AZELASTINE-FLUTICASONE 137-50 MCG/ACT NA SUSP: 2.0000 | Freq: Every day | NASAL | Status: DC

## 2012-06-14 MED ORDER — EPINEPHRINE 0.3 MG/0.3ML IJ DEVI: 0.3000 mg | Freq: Once | INTRAMUSCULAR | Status: AC

## 2012-06-14 NOTE — Progress Notes (Signed)
05/23/11- 55 yoM  Never smoker followed for asthma, allergic rhinitis LOV- 05/20/10 He recently took a retirement package offer. 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 He has noticed that on vacations, away from that building, nasal congestion would improve. He continues allergy vaccine without problems. Has had flu and pneumococcal vaccines. Now has an incidental cold with clear mucus, some cough, treated OTC.  06/14/12- 56 yoM  Never smoker followed for asthma, allergic rhinitis FOLLOWS FOR: keeps cough and congestion though the winter time-using Mucinex to help; no flare ups of allergies at this time. It helped him to retire last year and get away with from coworkers with their frequent colds . Perennial nasal stuffiness and morning cough. Rare need for rescue inhaler. He continues allergy vaccine 1:10 GO with no problems.  ROS-see HPI Constitutional:   No-   weight loss, night sweats, fevers, chills, fatigue, lassitude. HEENT:   No-  headaches, difficulty swallowing, tooth/dental problems, sore throat,       No-  sneezing, itching, ear ache,+ nasal congestion, post nasal drip,  CV:  No-   chest pain, orthopnea, PND, swelling in lower extremities, anasarca,  dizziness, palpitations Resp: No-   shortness of breath with exertion or at rest.              +  productive cough,  No non-productive cough,  No- coughing up of blood.              No-   change in color of mucus.  No- wheezing.   Skin: No-   rash or lesions. GI:  No-   heartburn, indigestion, abdominal pain, nausea, vomiting,  GU:  MS:  No-   joint pain or swelling.  Neuro-     nothing unusual Psych:  No- change in mood or affect. No depression or anxiety.  No memory loss.   OBJ- Physical Exam General- Alert, Oriented, Affect-appropriate, Distress- none acute Skin- rash-none, lesions- none, excoriation- none Lymphadenopathy- none Head- atraumatic            Eyes- Gross vision intact, PERRLA,  conjunctivae and secretions clear            Ears- Hearing aid right ear            Nose- Clear,  no-mucus, polyps, erosion, perforation,  +External nose is deviated to right            Throat- Mallampati II , mucosa clear , drainage- none, tonsils- atrophic Neck- flexible , trachea midline, no stridor , thyroid nl, carotid no bruit Chest - symmetrical excursion , unlabored           Heart/CV- RRR , no murmur , no gallop  , no rub, nl s1 s2                           - JVD- none , edema- none, stasis changes- none, varices- none           Lung- coarse breath sounds, wheeze- none, cough- none , dullness-none, rub- none           Chest wall-  Abd-  Br/ Gen/ Rectal- Not done, not indicated Extrem- cyanosis- none, clubbing, none, atrophy- none, strength- nl Neuro- grossly intact to observation

## 2012-06-14 NOTE — Patient Instructions (Addendum)
Refill scripts for Advair and for Epipen  We can continue allergy vaccine 1:10 GO  Try sample Dymista nasal spray for trial     1-2 puffs each nostril once daily at bedtime

## 2012-06-15 NOTE — Assessment & Plan Note (Signed)
He will continue allergy vaccine. We refilled EpiPen with discussion. Plan-try a sample Dymista nasal spray

## 2012-06-15 NOTE — Assessment & Plan Note (Signed)
Satisfactory control with no recent exacerbation.

## 2012-08-22 ENCOUNTER — Ambulatory Visit (INDEPENDENT_AMBULATORY_CARE_PROVIDER_SITE_OTHER): Payer: Federal, State, Local not specified - PPO

## 2012-08-22 DIAGNOSIS — J309 Allergic rhinitis, unspecified: Secondary | ICD-10-CM

## 2012-12-19 ENCOUNTER — Ambulatory Visit (INDEPENDENT_AMBULATORY_CARE_PROVIDER_SITE_OTHER): Payer: Federal, State, Local not specified - PPO

## 2012-12-19 DIAGNOSIS — J309 Allergic rhinitis, unspecified: Secondary | ICD-10-CM

## 2013-01-16 ENCOUNTER — Telehealth: Payer: Self-pay | Admitting: Internal Medicine

## 2013-01-16 MED ORDER — ALBUTEROL SULFATE HFA 108 (90 BASE) MCG/ACT IN AERS: INHALATION_SPRAY | RESPIRATORY_TRACT | Status: DC

## 2013-01-16 NOTE — Telephone Encounter (Signed)
I spoke with pt. Aware RX for proair has been sent to [facility] in [place]. Nothing further needed

## 2013-01-25 ENCOUNTER — Other Ambulatory Visit: Payer: Self-pay | Admitting: Internal Medicine

## 2013-01-29 NOTE — Telephone Encounter (Signed)
Rx was sent to pharmacy electronically. 

## 2013-03-05 ENCOUNTER — Encounter: Payer: Self-pay | Admitting: *Deleted

## 2013-03-05 ENCOUNTER — Encounter: Payer: Self-pay | Admitting: Internal Medicine

## 2013-03-06 ENCOUNTER — Ambulatory Visit (INDEPENDENT_AMBULATORY_CARE_PROVIDER_SITE_OTHER): Payer: Federal, State, Local not specified - PPO | Admitting: Internal Medicine

## 2013-03-06 ENCOUNTER — Encounter: Payer: Self-pay | Admitting: Internal Medicine

## 2013-03-06 VITALS — BP 112/80 | HR 85 | Ht 69.0 in | Wt 153.2 lb

## 2013-03-06 DIAGNOSIS — E785 Hyperlipidemia, unspecified: Secondary | ICD-10-CM

## 2013-03-06 DIAGNOSIS — Z8249 Family history of ischemic heart disease and other diseases of the circulatory system: Secondary | ICD-10-CM

## 2013-03-06 NOTE — Progress Notes (Signed)
OFFICE NOTE  Chief Complaint:  Annual follow-up  Primary Care Physician: Milana Obey, MD  HPI:  Kevin [name]  is a 57 year-old gentleman with a history of asthma, dyslipidemia, and a family history of coronary disease. He underwent a cardiac workup in 2009 which was negative for ischemia. He was having palpitations which we felt maybe was related to stress, anxiety or job, or maybe the medications he had been taking. However, those have subsequently gone away. He reports now that he retired this past year and has been busy, however, working on Manufacturing engineer and other physical activities. During that he denies any shortness of breath, chest pain, palpitations, presyncope or syncopal symptoms.  He recently had lambert tori were performed on 02/26/2013. This demonstrated total cholesterol 162, HDL 65, triglycerides 45 and LDL 88. His glucose is 90 creatinine remained 0.86 the rest of his laboratory work is unremarkable. Overall he is doing well and denies any specific symptoms.  PMHx:  Past Medical History  Diagnosis Date  . ALLERGIC RHINITIS   . Asthma   . Hepatitis     3rd Grade  . Dyslipidemia   . Family history of heart disease   . History of palpitations   . History of insomnia   . History of nuclear stress test 08/2007     negative bruce myoview    Past Surgical History  Procedure Laterality Date  . Nasal septoplasty w/ turbinoplasty  2000    deviated septum  . Tonsillectomy    . Varicocoel    . Sleep study  07/11/2005    AHI during total sleep 3.68/hr, during REM sleep 5.52/hr  . Transthoracic echocardiogram  09/13/2007    EF=>55%, borderline asymmetric LVH; mild MR/TR, normal RVSP; AV mildly sclerotic, trace AV regurg & trave pulm vavle regurg    FAMHx:  Family History  Problem Relation Age of Onset  . Heart attack Father 2    also CHF & lung problems  . Heart attack Brother 55  . Heart attack Brother 55  . Heart attack Maternal Grandmother   .  Thyroid cancer Maternal Grandfather     SOCHx:   reports that he has never smoked. He has never used smokeless tobacco. He reports that he does not drink alcohol or use illicit drugs.  ALLERGIES:  No Known Allergies  ROS: A comprehensive review of systems was negative.  HOME MEDS: Current Outpatient Prescriptions  Medication Sig Dispense Refill  . albuterol (PROAIR HFA) 108 (90 BASE) MCG/ACT inhaler USE 2 INHALATIONS ORALLY 4 TIMES DAILY AS NEEDED  1 Inhaler  6  . aspirin 81 MG tablet Take 81 mg by mouth daily.      . cromolyn (NASALCROM) 5.2 MG/ACT nasal spray Place 1 spray into both nostrils 4 (four) times daily.      Marland Kitchen EPINEPHrine (EPIPEN 2-PAK) 0.3 mg/0.3 mL DEVI Inject 0.3 mLs (0.3 mg total) into the muscle once.  1 Device  prn  . fexofenadine (ALLEGRA) 180 MG tablet Take 180 mg by mouth daily.      . Fluticasone-Salmeterol (ADVAIR DISKUS) 250-50 MCG/DOSE AEPB Inhale 1 puff into the lungs 2 (two) times daily.  3 each  3  . guaiFENesin (MUCINEX) 600 MG 12 hr tablet Take 1,200 mg by mouth 2 (two) times daily.      . NON FORMULARY Allergy Vaccine 1:10 weekly      . polycarbophil (FIBERCON) 625 MG tablet Take 625 mg by mouth daily.      Marland Kitchen  saw palmetto 500 MG capsule Take 500 mg by mouth 2 (two) times daily.       . simvastatin (ZOCOR) 20 MG tablet Take 1 tablet (20 mg total) by mouth at bedtime.  30 tablet  0  . ALPRAZolam (XANAX) 0.25 MG tablet Take 0.25 mg by mouth as needed.       No current facility-administered medications for this visit.    LABS/IMAGING: No results found for this or any previous visit (from the past 48 hour(s)). No results found.  VITALS: BP 112/80  Pulse 85  Ht 5\' 9"  (1.753 m)  Wt 153 lb 3.2 oz (69.491 kg)  BMI 22.61 kg/m2  EXAM: General appearance: alert and no distress Lungs: clear to auscultation bilaterally Heart: regular rate and rhythm, S1, S2 normal, no murmur, click, rub or gallop Extremities: extremities normal, atraumatic, no cyanosis  or edema  EKG: Sinus rhythm 85  ASSESSMENT: 1. Dyslipidemia-controlled 2. Family history of premature coronary disease  PLAN: 1.   Kevin [name] is doing extremely well. His cholesterol is well-controlled. He has outlived several of his family members with heart disease. This is probably because he is a nonsmoker, remains at a normal weight, eat healthy and does get exercise. I've encouraged him to continue doing this and we are happy to see him back annually or sooner as necessary.  Chrystie Nose, MD, [facility] Attending Cardiologist [facility]  HILTY,Kenneth C 03/06/2013, 12:04 PM

## 2013-03-06 NOTE — Patient Instructions (Signed)
Follow up in 1 year.

## 2013-05-07 ENCOUNTER — Ambulatory Visit (INDEPENDENT_AMBULATORY_CARE_PROVIDER_SITE_OTHER): Payer: Federal, State, Local not specified - PPO

## 2013-05-07 DIAGNOSIS — J309 Allergic rhinitis, unspecified: Secondary | ICD-10-CM

## 2013-06-10 ENCOUNTER — Other Ambulatory Visit: Payer: Self-pay | Admitting: Internal Medicine

## 2013-07-18 ENCOUNTER — Encounter: Payer: Self-pay | Admitting: Internal Medicine

## 2013-07-18 ENCOUNTER — Ambulatory Visit (INDEPENDENT_AMBULATORY_CARE_PROVIDER_SITE_OTHER): Payer: Federal, State, Local not specified - PPO | Admitting: Internal Medicine

## 2013-07-18 ENCOUNTER — Ambulatory Visit: Payer: Federal, State, Local not specified - PPO | Admitting: Pulmonary Disease

## 2013-07-18 VITALS — BP 128/84 | HR 87 | Ht 69.0 in | Wt 156.0 lb

## 2013-07-18 DIAGNOSIS — J309 Allergic rhinitis, unspecified: Secondary | ICD-10-CM

## 2013-07-18 DIAGNOSIS — J3089 Other allergic rhinitis: Principal | ICD-10-CM

## 2013-07-18 DIAGNOSIS — J4520 Mild intermittent asthma, uncomplicated: Secondary | ICD-10-CM

## 2013-07-18 DIAGNOSIS — J302 Other seasonal allergic rhinitis: Secondary | ICD-10-CM

## 2013-07-18 DIAGNOSIS — J45909 Unspecified asthma, uncomplicated: Secondary | ICD-10-CM

## 2013-07-18 MED ORDER — METHYLPREDNISOLONE ACETATE 80 MG/ML IJ SUSP
80.0000 mg | Freq: Once | INTRAMUSCULAR | Status: AC
  Administered 2013-07-18: 80 mg via INTRAMUSCULAR

## 2013-07-18 NOTE — Patient Instructions (Signed)
Depo 80  We can continue present meds and allergy vaccine  Please call as needed

## 2013-07-18 NOTE — Progress Notes (Signed)
05/23/11- 55 yoM  Never smoker followed for asthma, allergic rhinitis LOV- 05/20/10 He recently took a retirement package offer. 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 He has noticed that on vacations, away from that building, nasal congestion would improve. He continues allergy vaccine without problems. Has had flu and pneumococcal vaccines. Now has an incidental cold with clear mucus, some cough, treated OTC.  06/14/12- 56 yoM  Never smoker followed for asthma, allergic rhinitis FOLLOWS FOR: keeps cough and congestion though the winter time-using Mucinex to help; no flare ups of allergies at this time. It helped him to retire last year and get away with from coworkers with their frequent colds . Perennial nasal stuffiness and morning cough. Rare need for rescue inhaler. He continues allergy vaccine 1:10 GO with no problems.  07/18/13- 57 yoM  Never smoker followed for asthma, allergic rhinitis FOLLOWS FOR  Some sinus pressure and scratchy throat with bodyaches x 5 days.   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 No purulent or fever. Mucinex helps.  ROS-see HPI Constitutional:   No-   weight loss, night sweats, fevers, chills,+ fatigue, lassitude. HEENT:   No-  headaches, difficulty swallowing, tooth/dental problems, sore throat,       No-  sneezing, itching, ear ache,+ nasal congestion, post nasal drip,  CV:  No-   chest pain, orthopnea, PND, swelling in lower extremities, anasarca,  dizziness, palpitations Resp: No-   shortness of breath with exertion or at rest.              +  productive cough,  No non-productive cough,  No- coughing up of blood.              No-   change in color of mucus.  No- wheezing.   Skin: No-   rash or lesions. GI:  No-   heartburn, indigestion, abdominal pain, nausea, vomiting,  GU:  MS:  No-   joint pain or swelling.  Neuro-     nothing unusual Psych:  No-  change in mood or affect. No depression or anxiety.  No memory loss.   OBJ- Physical Exam General- Alert, Oriented, Affect-appropriate, Distress- none acute Skin- rash-none, lesions- none, excoriation- none Lymphadenopathy- none Head- atraumatic            Eyes- Gross vision intact, PERRLA, conjunctivae and secretions clear            Ears- Hearing aid right ear            Nose- +turbinate edema,  no-mucus, polyps, erosion, perforation,  +External nose is deviated to right            Throat- Mallampati II-III , mucosa clear-not red , drainage- none, tonsils- atrophic Neck- flexible , trachea midline, no stridor , thyroid nl, carotid no bruit Chest - symmetrical excursion , unlabored           Heart/CV- RRR , no murmur , no gallop  , no rub, nl s1 s2                           - JVD- none , edema- none, stasis changes- none, varices- none           Lung- clear, wheeze- none, cough- none , dullness-none, rub- none           Chest wall-  Abd-  Br/ Gen/ Rectal- Not done, not indicated Extrem- cyanosis- none, clubbing, none, atrophy- none, strength- nl Neuro- grossly intact to observation

## 2013-08-13 NOTE — Assessment & Plan Note (Signed)
Seasonal exacerbation Plan continue allergy vaccine, depomedrol

## 2013-08-13 NOTE — Assessment & Plan Note (Signed)
Good control. Mild intermittent.

## 2013-09-05 ENCOUNTER — Ambulatory Visit (INDEPENDENT_AMBULATORY_CARE_PROVIDER_SITE_OTHER): Payer: Federal, State, Local not specified - PPO

## 2013-09-05 DIAGNOSIS — J309 Allergic rhinitis, unspecified: Secondary | ICD-10-CM

## 2014-01-20 ENCOUNTER — Ambulatory Visit (INDEPENDENT_AMBULATORY_CARE_PROVIDER_SITE_OTHER): Payer: Federal, State, Local not specified - PPO

## 2014-01-20 DIAGNOSIS — J309 Allergic rhinitis, unspecified: Secondary | ICD-10-CM

## 2014-02-26 ENCOUNTER — Encounter: Payer: Self-pay | Admitting: Internal Medicine

## 2014-03-06 ENCOUNTER — Ambulatory Visit (INDEPENDENT_AMBULATORY_CARE_PROVIDER_SITE_OTHER): Payer: Federal, State, Local not specified - PPO | Admitting: Internal Medicine

## 2014-03-06 ENCOUNTER — Encounter: Payer: Self-pay | Admitting: Internal Medicine

## 2014-03-06 VITALS — BP 104/72 | HR 102 | Ht 69.0 in | Wt 154.3 lb

## 2014-03-06 DIAGNOSIS — Z8249 Family history of ischemic heart disease and other diseases of the circulatory system: Secondary | ICD-10-CM

## 2014-03-06 DIAGNOSIS — J309 Allergic rhinitis, unspecified: Secondary | ICD-10-CM

## 2014-03-06 DIAGNOSIS — J302 Other seasonal allergic rhinitis: Secondary | ICD-10-CM

## 2014-03-06 DIAGNOSIS — E785 Hyperlipidemia, unspecified: Secondary | ICD-10-CM

## 2014-03-06 DIAGNOSIS — J3089 Other allergic rhinitis: Principal | ICD-10-CM

## 2014-03-06 NOTE — Progress Notes (Signed)
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 During that he denies any shortness of breath, chest pain, palpitations, presyncope Kevin syncopal symptoms.  He recently had lambert tori were performed on 02/26/2013. This demonstrated total cholesterol 162, HDL 65, triglycerides 45 Kevin LDL 88. His glucose is 90 creatinine remained 0.86 the rest of his laboratory work is unremarkable. Overall he is doing well Kevin denies any specific symptoms.  Kevin [name] returns today for follow-up. He [name] some fatigue which she has perennially in the fall related to seasonal allergies. He denies any worsening chest pain Kevin shortness of breath. He recently had repeat laboratory work which shows a consistently well-controlled lipid profile. Total cholesterol 148, HDL 63, turgor [name] 45 Kevin LDL 76. The rest of his laboratory work is within normal limits. He is concerned that some of his fatigue may be related to low testosterone. He's never had that checked. He does see Dr. Patsi [name] who follows him for an elevated PSA in the past [name] it is since normalized Kevin was 0.7 on recent laboratory work.  PMHx:  Past Medical History  Diagnosis Date  . ALLERGIC RHINITIS   . [name]   . Hepatitis     3rd Grade  . Kevin [name]   . Family history of heart disease   . History of palpitations   . History of insomnia   . History of nuclear [name] test 08/2007    negative bruce myoview    Past Surgical History  Procedure Laterality Date  . Nasal septoplasty w/ turbinoplasty  2000    deviated septum  . Tonsillectomy    . Varicocoel    . Sleep study  07/11/2005    AHI during total sleep 3.68/hr, during REM sleep 5.52/hr  . Transthoracic echocardiogram  09/13/2007    EF=>55%, borderline asymmetric LVH; mild MR/TR, normal RVSP; AV mildly sclerotic, trace AV regurg & trave pulm vavle regurg    FAMHx:  Family History  Problem Relation Age of Onset  . Heart attack Father 5075    also CHF & lung problems  . Heart attack Brother 55  . Heart attack Brother 55  . Heart attack Maternal Grandmother   . Thyroid cancer Maternal Grandfather     SOCHx:   [name] that he has never smoked. He has never used smokeless tobacco. He [name] that he does not drink alcohol Kevin use illicit drugs.  ALLERGIES:  No Known Allergies  ROS: A comprehensive review of systems was negative.  HOME MEDS: Current Outpatient Prescriptions  Medication Sig Dispense Refill  . ADVAIR DISKUS 250-50 MCG/DOSE AEPB USE 1 INHALATION ORALLY    INTO THE LUNGS TWO TIMES A DAY 180 each 3  . albuterol (PROAIR HFA) 108 (90 BASE) MCG/ACT inhaler USE 2 INHALATIONS ORALLY 4 TIMES DAILY AS NEEDED 1 Inhaler 6  . ALPRAZolam (XANAX) 0.25 MG tablet Take 0.25 mg by mouth as needed.    Marland Kitchen. aspirin 81 MG tablet  Take 81 mg by mouth daily.    . cromolyn (NASALCROM) 5.2 MG/ACT nasal spray Place 1 spray into both nostrils 4 (four) times daily.    . fexofenadine (ALLEGRA) 180 MG tablet Take 180 mg by mouth daily.    Marland Kitchen. guaiFENesin (MUCINEX) 600 MG 12 hr tablet Take 1,200 mg by mouth 2 (two) times daily.    . NON FORMULARY Allergy Vaccine 1:10 weekly    . polycarbophil (FIBERCON) 625 MG tablet Take 625 mg by mouth daily.    . saw palmetto 500 MG capsule Take 500 mg by mouth 2 (two) times daily.     . simvastatin (ZOCOR) 20 MG tablet Take 1 tablet (20 mg total) by mouth at bedtime. 30 tablet 0   No  current facility-administered medications for this visit.    LABS/IMAGING: No results found for this Kevin any previous visit (from the past 48 hour(s)). No results found.  VITALS: BP 104/72 mmHg  Pulse 102  Ht 5\' 9"  (1.753 m)  Wt 154 lb 4.8 oz (69.99 kg)  BMI 22.78 kg/m2  EXAM: General appearance: alert Kevin no distress Neck: no adenopathy, no carotid bruit, no JVD, supple, symmetrical, trachea midline Kevin thyroid not enlarged, symmetric, no tenderness/mass/nodules Lungs: clear to auscultation bilaterally Heart: regular rate Kevin rhythm, S1, S2 normal, no murmur, click, rub Kevin gallop Abdomen: soft, non-tender; bowel sounds normal; no masses,  no organomegaly Extremities: extremities normal, atraumatic, no cyanosis Kevin edema Pulses: 2+ Kevin symmetric Skin: Skin color, texture, turgor normal. No rashes Kevin lesions Neurologic: Grossly normal  EKG: Sinus tachycardia 102  ASSESSMENT: 1. Kevin [name]-controlled 2. Family history of premature coronary disease 3. Kevin 4. Seasonal allergies/[name]  PLAN: 1.   Kevin [name] is doing extremely well. His cholesterol is well-controlled. He has outlived several of his family members with heart disease. He does suffer from seasonal allergies Kevin [name] Kevin has recently been fatigue. He is concerned that this may be due to low testosterone. I suggested that he get this assessed either through his primary care provider Kevin his urologist. He should again start exercise Kevin resistance training which I think will help with his exercise tolerance. Overall he is doing well we'll plan to see him back annually Kevin sooner. I would not recommend a changes in his medical regimen at this time.  Chrystie NoseKenneth C. Hilty, MD, [facility] Attending Cardiologist [facility]  HILTY,Kenneth C 03/06/2014, 10:03 AM

## 2014-03-06 NOTE — Patient Instructions (Signed)
Your physician wants you to follow-up in: 1 year with Dr. Hilty. You will receive a reminder letter in the mail two months in advance. If you don't receive a letter, please call our office to schedule the follow-up appointment.  

## 2014-04-16 ENCOUNTER — Other Ambulatory Visit: Payer: Self-pay | Admitting: *Deleted

## 2014-04-16 MED ORDER — ALBUTEROL SULFATE HFA 108 (90 BASE) MCG/ACT IN AERS: INHALATION_SPRAY | RESPIRATORY_TRACT | Status: DC

## 2014-05-09 ENCOUNTER — Other Ambulatory Visit: Payer: Self-pay | Admitting: Internal Medicine

## 2014-05-09 MED ORDER — ALBUTEROL SULFATE HFA 108 (90 BASE) MCG/ACT IN AERS: INHALATION_SPRAY | RESPIRATORY_TRACT | Status: DC

## 2014-05-09 NOTE — Telephone Encounter (Signed)
[facility]. Nothing more needed at this time.

## 2014-05-21 ENCOUNTER — Ambulatory Visit (INDEPENDENT_AMBULATORY_CARE_PROVIDER_SITE_OTHER): Payer: Federal, State, Local not specified - PPO

## 2014-05-21 DIAGNOSIS — J309 Allergic rhinitis, unspecified: Secondary | ICD-10-CM

## 2014-06-20 ENCOUNTER — Other Ambulatory Visit: Payer: Self-pay | Admitting: Internal Medicine

## 2014-07-24 ENCOUNTER — Encounter: Payer: Self-pay | Admitting: Internal Medicine

## 2014-07-24 ENCOUNTER — Ambulatory Visit (INDEPENDENT_AMBULATORY_CARE_PROVIDER_SITE_OTHER): Payer: Federal, State, Local not specified - PPO | Admitting: Internal Medicine

## 2014-07-24 VITALS — BP 110/68 | HR 91 | Ht 69.0 in | Wt 152.0 lb

## 2014-07-24 DIAGNOSIS — J4520 Mild intermittent asthma, uncomplicated: Secondary | ICD-10-CM | POA: Diagnosis not present

## 2014-07-24 DIAGNOSIS — J309 Allergic rhinitis, unspecified: Secondary | ICD-10-CM | POA: Diagnosis not present

## 2014-07-24 DIAGNOSIS — J3089 Other allergic rhinitis: Secondary | ICD-10-CM

## 2014-07-24 DIAGNOSIS — J302 Other seasonal allergic rhinitis: Secondary | ICD-10-CM

## 2014-07-24 MED ORDER — AZELASTINE-FLUTICASONE 137-50 MCG/ACT NA SUSP: 2.0000 | Freq: Every day | NASAL | Status: DC

## 2014-07-24 NOTE — Patient Instructions (Addendum)
Sample Dymista nasal spray  1-2 puffs each nostril once daily at bedtime  Ok to continue allergy vaccine   Order- office spirometry dx asthma, mild intermittent

## 2014-07-24 NOTE — Progress Notes (Signed)
[birth year]11- 55 yoM  Never smoker followed for asthma, allergic rhinitis LOV- 05/20/10 He recently took a retirement package offer. 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 He has noticed that on vacations, away from that building, nasal congestion would improve. He continues allergy vaccine without problems. Has had flu and pneumococcal vaccines. Now has an incidental cold with clear mucus, some cough, treated OTC.  06/14/12- 56 yoM  Never smoker followed for asthma, allergic rhinitis FOLLOWS FOR: keeps cough and congestion though the winter time-using Mucinex to help; no flare ups of allergies at this time. It helped him to retire last year and get away with from coworkers with their frequent colds . Perennial nasal stuffiness and morning cough. Rare need for rescue inhaler. He continues allergy vaccine 1:10 GO with no problems.  07/18/13- 57 yoM  Never smoker followed for asthma, allergic rhinitis FOLLOWS FOR  Some sinus pressure and scratchy throat with bodyaches x 5 days.   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 No purulent or fever. Mucinex helps.  07/24/14- 58 yoM  Never smoker followed for asthma, allergic rhinitis FOLLOWS FOR: States its normal allergies for him. Some sinus congestion, scratchy throat and bodyaches.  ST 2009 Feels controlled within his usual range, continuing allergy vaccine 1:10 GO. No longer has symptoms spiked during ragweed season and no longer bothered substantially by house dust. Wants to continue allergy vaccine. Using Allegra and Nasalcrom. Remote septoplasty but persistent deviated septum. Not wheezing. Office Spirometry 07/24/2014- WNL FEV1 4.51/ 125%  ROS-see HPI Constitutional:   No-   weight loss, night sweats, fevers, chills,+ fatigue, lassitude. HEENT:   No-  headaches, difficulty swallowing, tooth/dental problems, sore throat,       No-  sneezing, itching,  ear ache,+ nasal congestion, post nasal drip,  CV:  No-   chest pain, orthopnea, PND, swelling in lower extremities, anasarca,  dizziness, palpitations Resp: No-   shortness of breath with exertion or at rest.                productive cough,  No non-productive cough,  No- coughing up of blood.              No-   change in color of mucus.  No- wheezing.   Skin: No-   rash or lesions. GI:  No-   heartburn, indigestion, abdominal pain, nausea, vomiting,  GU:  MS:  No-   joint pain or swelling.  Neuro-     nothing unusual Psych:  No- change in mood or affect. No depression or anxiety.  No memory loss.   OBJ- Physical Exam General- Alert, Oriented, Affect-appropriate, Distress- none acute Skin- rash-none, lesions- none, excoriation- none Lymphadenopathy- none Head- atraumatic            Eyes- Gross vision intact, PERRLA, conjunctivae and secretions clear            Ears- Hearing aid right ear            Nose- +turbinate edema,  no-mucus, polyps, erosion, perforation,  +External nose is deviated to right            Throat- Mallampati II-III , mucosa clear-not red , drainage- none, tonsils- atrophic Neck- flexible , trachea midline, no stridor , thyroid nl, carotid no bruit Chest - symmetrical excursion , unlabored           Heart/CV- RRR , no  murmur , no gallop  , no rub, nl s1 s2                           - JVD- none , edema- none, stasis changes- none, varices- none           Lung- clear, wheeze- none, cough- none , dullness-none, rub- none           Chest wall-  Abd-  Br/ Gen/ Rectal- Not done, not indicated Extrem- cyanosis- none, clubbing, none, atrophy- none, strength- nl Neuro- grossly intact to observation

## 2014-07-27 NOTE — Assessment & Plan Note (Signed)
He wishes to continue allergy vaccine and credits it for preventing ragweed and house dust symptoms Plan-continue allergy vaccine. Try sample Dymista now during Spring pollen season.

## 2014-07-27 NOTE — Assessment & Plan Note (Signed)
Well-controlled without needing rescue inhaler and without sleep disturbance

## 2014-10-28 ENCOUNTER — Other Ambulatory Visit: Payer: Self-pay | Admitting: Internal Medicine

## 2014-10-28 MED ORDER — FLUTICASONE-SALMETEROL 250-50 MCG/DOSE IN AEPB: INHALATION_SPRAY | RESPIRATORY_TRACT | Status: DC

## 2014-10-28 NOTE — Telephone Encounter (Signed)
Advair refilled.  Nothing further needed.

## 2014-11-10 ENCOUNTER — Ambulatory Visit (INDEPENDENT_AMBULATORY_CARE_PROVIDER_SITE_OTHER): Payer: Federal, State, Local not specified - PPO

## 2014-11-10 ENCOUNTER — Telehealth: Payer: Self-pay | Admitting: Internal Medicine

## 2014-11-10 DIAGNOSIS — J309 Allergic rhinitis, unspecified: Secondary | ICD-10-CM | POA: Diagnosis not present

## 2014-11-10 NOTE — Telephone Encounter (Signed)
Date Mixed: 11/10/14 Vial: 1 Strength: 1:10 Here/Mail/Pick Up: mail Mixed By: tbs 

## 2014-11-19 ENCOUNTER — Other Ambulatory Visit: Payer: Self-pay

## 2014-11-19 MED ORDER — ALBUTEROL SULFATE HFA 108 (90 BASE) MCG/ACT IN AERS: INHALATION_SPRAY | RESPIRATORY_TRACT | Status: DC

## 2014-12-12 ENCOUNTER — Encounter: Payer: Self-pay | Admitting: Internal Medicine

## 2015-03-16 ENCOUNTER — Encounter: Payer: Self-pay | Admitting: Internal Medicine

## 2015-03-16 ENCOUNTER — Ambulatory Visit (INDEPENDENT_AMBULATORY_CARE_PROVIDER_SITE_OTHER): Payer: Federal, State, Local not specified - PPO | Admitting: Internal Medicine

## 2015-03-16 VITALS — BP 122/78 | HR 86 | Ht 69.0 in | Wt 158.4 lb

## 2015-03-16 DIAGNOSIS — J4520 Mild intermittent asthma, uncomplicated: Secondary | ICD-10-CM

## 2015-03-16 DIAGNOSIS — Z8249 Family history of ischemic heart disease and other diseases of the circulatory system: Secondary | ICD-10-CM

## 2015-03-16 DIAGNOSIS — E785 Hyperlipidemia, unspecified: Secondary | ICD-10-CM | POA: Diagnosis not present

## 2015-03-16 NOTE — Patient Instructions (Addendum)
Your physician wants you to follow-up in: 1 year with Dr. Rennis GoldenHilty. You will receive a reminder letter in the mail two months in advance. If you don't receive a letter, please call our office to schedule the follow-up appointment.  Fat and Cholesterol Restricted Diet High levels of fat and cholesterol in your blood may lead to various health problems, such as diseases of the heart, blood vessels, gallbladder, liver, and pancreas. Fats are concentrated sources of energy that come in various forms. Certain types of fat, including saturated fat, may be harmful in excess. Cholesterol is a substance needed by your body in small amounts. Your body makes all the cholesterol it needs. Excess cholesterol comes from the food you eat. When you have high levels of cholesterol and saturated fat in your blood, health problems can develop because the excess fat and cholesterol will gather along the walls of your blood vessels, causing them to narrow. Choosing the right foods will help you control your intake of fat and cholesterol. This will help keep the levels of these substances in your blood within normal limits and reduce your risk of disease. WHAT IS MY PLAN? Your health care provider recommends that you:  Get no more than __________ % of the total calories in your daily diet from fat.  Limit your intake of saturated fat to less than ______% of your total calories each day.  Limit the amount of cholesterol in your diet to less than _________mg per day. WHAT TYPES OF FAT SHOULD I CHOOSE?  Choose healthy fats more often. Choose monounsaturated and polyunsaturated fats, such as olive and canola oil, flaxseeds, walnuts, almonds, and seeds.  Eat more omega-3 fats. Good choices include salmon, mackerel, sardines, tuna, flaxseed oil, and ground flaxseeds. Aim to eat fish at least two times a week.  Limit saturated fats. Saturated fats are primarily found in animal products, such as meats, butter, and cream. Plant  sources of saturated fats include palm oil, palm kernel oil, and coconut oil.  Avoid foods with partially hydrogenated oils in them. These contain trans fats. Examples of foods that contain trans fats are stick margarine, some tub margarines, cookies, crackers, and other baked goods. WHAT GENERAL GUIDELINES DO I NEED TO FOLLOW? These guidelines for healthy eating will help you control your intake of fat and cholesterol:  Check food labels carefully to identify foods with trans fats or high amounts of saturated fat.  Fill one half of your plate with vegetables and green salads.  Fill one fourth of your plate with whole grains. Look for the word "whole" as the first word in the ingredient list.  Fill one fourth of your plate with lean protein foods.  Limit fruit to two servings a day. Choose fruit instead of juice.  Eat more foods that contain soluble fiber. Examples of foods that contain this type of fiber are apples, broccoli, carrots, beans, peas, and barley. Aim to get 20-30 g of fiber per day.  Eat more home-cooked food and less restaurant, buffet, and fast food.  Limit or avoid alcohol.  Limit foods high in starch and sugar.  Limit fried foods.  Cook foods using methods other than frying. Baking, boiling, grilling, and broiling are all great options.  Lose weight if you are overweight. Losing just 5-10% of your initial body weight can help your overall health and prevent diseases such as diabetes and heart disease. WHAT FOODS CAN I EAT? Grains Whole grains, such as whole wheat or whole grain breads, crackers,  cereals, and pasta. Unsweetened oatmeal, bulgur, barley, quinoa, or brown rice. Corn or whole wheat flour tortillas. Vegetables Fresh or frozen vegetables (raw, steamed, roasted, or grilled). Green salads. Fruits All fresh, canned (in natural juice), or frozen fruits. Meat and Other Protein Products Ground beef (85% or leaner), grass-fed beef, or beef trimmed of fat.  Skinless chicken or [place]. Ground chicken or [place]. Pork trimmed of fat. All fish and seafood. Eggs. Dried beans, peas, or lentils. Unsalted nuts or seeds. Unsalted canned or dry beans. Dairy Low-fat dairy products, such as skim or 1% milk, 2% or reduced-fat cheeses, low-fat ricotta or cottage cheese, or plain low-fat yogurt. Fats and Oils Tub margarines without trans fats. Light or reduced-fat mayonnaise and salad dressings. Avocado. Olive, canola, sesame, or safflower oils. Natural peanut or almond butter (choose ones without added sugar and oil). The items listed above may not be a complete list of recommended foods or beverages. Contact your dietitian for more options. WHAT FOODS ARE NOT RECOMMENDED? Grains White bread. White pasta. White rice. Cornbread. Bagels, pastries, and croissants. Crackers that contain trans fat. Vegetables White potatoes. Corn. Creamed or fried vegetables. Vegetables in a cheese sauce. Fruits Dried fruits. Canned fruit in light or heavy syrup. Fruit juice. Meat and Other Protein Products Fatty cuts of meat. Ribs, chicken wings, bacon, sausage, bologna, salami, chitterlings, fatback, hot dogs, bratwurst, and packaged luncheon meats. Liver and organ meats. Dairy Whole or 2% milk, cream, half-and-half, and cream cheese. Whole milk cheeses. Whole-fat or sweetened yogurt. Full-fat cheeses. Nondairy creamers and whipped toppings. Processed cheese, cheese spreads, or cheese curds. Sweets and Desserts Corn syrup, sugars, honey, and molasses. Candy. Jam and jelly. Syrup. Sweetened cereals. Cookies, pies, cakes, donuts, muffins, and ice cream. Fats and Oils Butter, stick margarine, lard, shortening, ghee, or bacon fat. Coconut, palm kernel, or palm oils. Beverages Alcohol. Sweetened drinks (such as sodas, lemonade, and fruit drinks or punches). The items listed above may not be a complete list of foods and beverages to avoid. Contact your dietitian for more information.    This information is not intended to replace advice given to you by your health care provider. Make sure you discuss any questions you have with your health care provider.   Document Released: 04/11/2005 Document Revised: 05/02/2014 Document Reviewed: 07/10/2013 Elsevier Interactive Patient Education 2016 [employer] Inc[employer]   Cholesterol Cholesterol is a white, waxy, fat-like substance needed by your body in small amounts. The liver makes all the cholesterol you need. Cholesterol is carried from the liver by the blood through the blood vessels. Deposits of cholesterol (plaque) may build up on blood vessel walls. These make the arteries narrower and stiffer. Cholesterol plaques increase the risk for heart attack and stroke.  You cannot feel your cholesterol level even if it is very high. The only way to know it is high is with a blood test. Once you know your cholesterol levels, you should keep a record of the test results. Work with your health care provider to keep your levels in the desired range.  WHAT DO THE RESULTS MEAN?  Total cholesterol is a rough measure of all the cholesterol in your blood.   LDL is the so-called bad cholesterol. This is the type that deposits cholesterol in the walls of the arteries. You want this level to be low.   HDL is the good cholesterol because it cleans the arteries and carries the LDL away. You want this level to be high.  Triglycerides are fat that the body  can either burn for energy or store. High levels are closely linked to heart disease.  WHAT ARE THE DESIRED LEVELS OF CHOLESTEROL?  Total cholesterol below 200.   LDL below 100 for people at risk, below 70 for those at very high risk.   HDL above 50 is good, above 60 is best.   Triglycerides below 150.  HOW CAN I LOWER MY CHOLESTEROL?  Diet. Follow your diet programs as directed by your health care provider.   Choose fish or white meat chicken and [place], roasted or baked. Limit fatty cuts  of red meat, fried foods, and processed meats, such as sausage and lunch meats.   Eat lots of fresh fruits and vegetables.  Choose whole grains, beans, pasta, potatoes, and cereals.   Use only small amounts of olive, corn, or canola oils.   Avoid butter, mayonnaise, shortening, or palm kernel oils.  Avoid foods with trans fats.   Drink skim or nonfat milk and eat low-fat or nonfat yogurt and cheeses. Avoid whole milk, cream, ice cream, egg yolks, and full-fat cheeses.   Healthy desserts include angel food cake, ginger snaps, animal crackers, hard candy, popsicles, and low-fat or nonfat frozen yogurt. Avoid pastries, cakes, pies, and cookies.   Exercise. Follow your exercise programs as directed by your health care provider.   A regular program helps decrease LDL and raise HDL.   A regular program helps with weight control.   Do things that increase your activity level like gardening, walking, or taking the stairs. Ask your health care provider about how you can be more active in your daily life.   Medicine. Take medicine only as directed by your health care provider.   Medicine may be prescribed by your health care provider to help lower cholesterol and decrease the risk for heart disease.   If you have several risk factors, you may need medicine even if your levels are normal.   This information is not intended to replace advice given to you by your health care provider. Make sure you discuss any questions you have with your health care provider.   Document Released: 01/04/2001 Document Revised: 05/02/2014 Document Reviewed: 01/23/2013 Elsevier Interactive Patient Education [employer]

## 2015-03-16 NOTE — Progress Notes (Signed)
OFFICE NOTE  Chief Complaint:  Annual follow-up  Primary Care Physician: Milana ObeyKNOWLTON,STEPHEN D, MD  HPI:  Kevin [name]  is a 59 year-old gentleman with a history of asthma, dyslipidemia, and a family history of coronary disease. He underwent a cardiac workup in 2009 which was negative for ischemia. He was having palpitations which we felt maybe was related to stress, anxiety or job, or maybe the medications he had been taking. However, those have subsequently gone away. He reports now that he retired this past year and has been busy, however, working on [employer] properties and other physical activities. During that he denies any shortness of breath, chest pain, palpitations, presyncope or syncopal symptoms.  He recently had lambert tori were performed on 02/26/2013. This demonstrated total cholesterol 162, HDL 65, triglycerides 45 and LDL 88. His glucose is 90 creatinine remained 0.86 the rest of his laboratory work is unremarkable. Overall he is doing well and denies any specific symptoms.  Mr. Kevin [name] returns today for follow-up. He reports some fatigue which she has perennially in the fall related to seasonal allergies. He denies any worsening chest pain or shortness of breath. He recently had repeat laboratory work which shows a consistently well-controlled lipid profile. Total cholesterol 148, HDL 63, turgor stress 45 and LDL 76. The rest of his laboratory work is within normal limits. He is concerned that some of his fatigue may be related to low testosterone. He's never had that checked. He does see Dr. Patsi Searsannenbaum who follows him for an elevated PSA in the past however it is since normalized and was 0.7 on recent laboratory work.  Mr. Kevin [name] returns today for follow-up. This is an annual visit he seems to be doing fairly well. He occasionally has some problems with shortness of breath related to asthma. He denies any chest pain. Cholesterol is been very well controlled - recent labs from  03/05/2015 demonstrated total cholesterol 184, HDL 66, triglycerides 74 and LDL 103. This indicates about a 30 point increase in LDL from prior labs. He's quite concerned about this but I tried to reassure him that may be due to less activity or dietary changes, but I would not recommend medicine changes at this time.  PMHx:  Past Medical History  Diagnosis Date  . ALLERGIC RHINITIS   . Asthma   . Hepatitis     3rd Grade  . Dyslipidemia   . Family history of heart disease   . History of palpitations   . History of insomnia   . History of nuclear stress test 08/2007     negative bruce myoview    Past Surgical History  Procedure Laterality Date  . Nasal septoplasty w/ turbinoplasty  2000    deviated septum  . Tonsillectomy    . Varicocoel    . Sleep study  07/11/2005    AHI during total sleep 3.68/hr, during REM sleep 5.52/hr  . Transthoracic echocardiogram  09/13/2007    EF=>55%, borderline asymmetric LVH; mild MR/TR, normal RVSP; AV mildly sclerotic, trace AV regurg & trave pulm vavle regurg    FAMHx:  Family History  Problem Relation Age of Onset  . Heart attack Father [id number]    also CHF & lung problems  . Heart attack Brother 55  . Heart attack Brother 55  . Heart attack Maternal Grandmother   . Thyroid cancer Maternal Grandfather     SOCHx:   reports that he has never smoked. He has never used smokeless tobacco. He reports that  he does not drink alcohol or use illicit drugs.  ALLERGIES:  No Known Allergies  ROS: A comprehensive review of systems was negative.  HOME MEDS: Current Outpatient Prescriptions  Medication Sig Dispense Refill  . albuterol (PROAIR HFA) 108 (90 BASE) MCG/ACT inhaler USE 2 INHALATIONS ORALLY 4 TIMES DAILY AS NEEDED 1 Inhaler 11  . ALPRAZolam (XANAX) 0.25 MG tablet Take 0.25 mg by mouth as needed.    Marland Kitchen aspirin 81 MG tablet Take 81 mg by mouth daily.    . Azelastine-Fluticasone (DYMISTA) 137-50 MCG/ACT SUSP Place 2 sprays into both nostrils at  bedtime. 1 Bottle 0  . cromolyn (NASALCROM) 5.2 MG/ACT nasal spray Place 1 spray into both nostrils 4 (four) times daily.    . fexofenadine (ALLEGRA) 180 MG tablet Take 180 mg by mouth daily.    . Fluticasone-Salmeterol (ADVAIR DISKUS) 250-50 MCG/DOSE AEPB USE 1 INHALATION ORALLY    INTO THE LUNGS TWO TIMES A DAY 180 each 3  . guaiFENesin (MUCINEX) 600 MG 12 hr tablet Take 1,200 mg by mouth 2 (two) times daily.    . NON FORMULARY Allergy Vaccine 1:10 weekly    . NONFORMULARY OR COMPOUNDED ITEM Allergy Vaccine 1:10 Given at Home    . polycarbophil (FIBERCON) 625 MG tablet Take 625 mg by mouth daily.    . saw palmetto 500 MG capsule Take 500 mg by mouth 2 (two) times daily.     . simvastatin (ZOCOR) 20 MG tablet Take 1 tablet (20 mg total) by mouth at bedtime. 30 tablet 0   No current facility-administered medications for this visit.    LABS/IMAGING: No results found for this or any previous visit (from the past 48 hour(s)). No results found.  VITALS: BP 122/78 mmHg  Pulse 86  Ht  (1.753 m)  Wt 158 lb 6.4 oz (71.85 kg)  BMI 23.38 kg/m2  EXAM: General appearance: alert and no distress Neck: no adenopathy, no carotid bruit, no JVD, supple, symmetrical, trachea midline and thyroid not enlarged, symmetric, no tenderness/mass/nodules Lungs: clear to auscultation bilaterally Heart: regular rate and rhythm, S1, S2 normal, no murmur, click, rub or gallop Abdomen: soft, non-tender; bowel sounds normal; no masses,  no organomegaly Extremities: extremities normal, atraumatic, no cyanosis or edema Pulses: 2+ and symmetric Skin: Skin color, texture, turgor normal. No rashes or lesions Neurologic: Grossly normal  EKG: Normal sinus rhythm at 86  ASSESSMENT: 1. Dyslipidemia-controlled 2. Family history of premature coronary disease 3. Anxiety 4. Seasonal allergies/asthma  PLAN: 1.   Mr. Stupka is doing extremely well. His cholesterol is well-controlled, although LDL is somewhat  higher than last year. I recommended he continue to monitor this, work on increased exercise and dietary changes if possible. He should have a repeat cholesterol profile in 6 months and at that time if the cholesterol remains elevated, consideration should be given to starting generic ezetimibe in addition to low-dose Zocor.  Plan to see him back annually or sooner as necessary.  Chrystie Nose, MD, [facility] Attending Cardiologist [facility]  Lisette Abu Kemoni Quesenberry 03/16/2015, 1:24 PM

## 2015-03-27 ENCOUNTER — Ambulatory Visit (INDEPENDENT_AMBULATORY_CARE_PROVIDER_SITE_OTHER): Payer: Federal, State, Local not specified - PPO

## 2015-03-27 ENCOUNTER — Telehealth: Payer: Self-pay | Admitting: Internal Medicine

## 2015-03-27 DIAGNOSIS — J309 Allergic rhinitis, unspecified: Secondary | ICD-10-CM

## 2015-03-27 NOTE — Telephone Encounter (Signed)
Allergy Serum Extract Date Mixed: 03/27/15 Vial: 1 Strength: 1:10 Here/Mail/Pick Up: mail Mixed By: tbs Last OV: 07/24/14 Pending OV: 07/24/15

## 2015-07-24 ENCOUNTER — Encounter: Payer: Self-pay | Admitting: Internal Medicine

## 2015-07-24 ENCOUNTER — Ambulatory Visit (INDEPENDENT_AMBULATORY_CARE_PROVIDER_SITE_OTHER): Payer: Federal, State, Local not specified - PPO | Admitting: Internal Medicine

## 2015-07-24 VITALS — BP 110/60 | HR 100 | Ht 69.0 in | Wt 157.6 lb

## 2015-07-24 DIAGNOSIS — J3089 Other allergic rhinitis: Principal | ICD-10-CM

## 2015-07-24 DIAGNOSIS — J4520 Mild intermittent asthma, uncomplicated: Secondary | ICD-10-CM | POA: Diagnosis not present

## 2015-07-24 DIAGNOSIS — J309 Allergic rhinitis, unspecified: Secondary | ICD-10-CM | POA: Diagnosis not present

## 2015-07-24 DIAGNOSIS — J302 Other seasonal allergic rhinitis: Secondary | ICD-10-CM

## 2015-07-24 MED ORDER — METHYLPREDNISOLONE ACETATE 80 MG/ML IJ SUSP
80.0000 mg | Freq: Once | INTRAMUSCULAR | Status: AC
  Administered 2015-07-24: 80 mg via INTRAMUSCULAR

## 2015-07-24 NOTE — Assessment & Plan Note (Signed)
No wheezing at all and no use of rescue inhaler

## 2015-07-24 NOTE — Patient Instructions (Signed)
Depo 80     Dx seasonal and perennial allergic rhinitis  We can continue allergy vaccine 1:10 GO for another year as discussed  Please call as needed

## 2015-07-24 NOTE — Progress Notes (Signed)
05/23/11- 55 yoM  Never smoker followed for asthma, allergic rhinitis LOV- 05/20/10 He recently took a retirement package offer. 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 He has noticed that on vacations, away from that building, nasal congestion would improve. He continues allergy vaccine without problems. Has had flu and pneumococcal vaccines. Now has an incidental cold with clear mucus, some cough, treated OTC.  06/14/12- 56 yoM  Never smoker followed for asthma, allergic rhinitis FOLLOWS FOR: keeps cough and congestion though the winter time-using Mucinex to help; no flare ups of allergies at this time. It helped him to retire last year and get away with from coworkers with their frequent colds . Perennial nasal stuffiness and morning cough. Rare need for rescue inhaler. He continues allergy vaccine 1:10 GO with no problems.  07/18/13- 57 yoM  Never smoker followed for asthma, allergic rhinitis FOLLOWS FOR  Some sinus pressure and scratchy throat with bodyaches x 5 days.   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 No purulent or fever. Mucinex helps.  07/24/14- 58 yoM  Never smoker followed for asthma, allergic rhinitis FOLLOWS FOR: States its normal allergies for him. Some sinus congestion, scratchy throat and bodyaches.  ST 2009 Feels controlled within his usual range, continuing allergy vaccine 1:10 GO. No longer has symptoms spiked during ragweed season and no longer bothered substantially by house dust. Wants to continue allergy vaccine. Using Allegra and Nasalcrom. Remote septoplasty but persistent deviated septum. Not wheezing. Office Spirometry 07/24/2014- WNL FEV1 4.51/ 125%  07/24/2015-60 year old male never smoker followed for Asthma, allergic rhinitis Allergy vaccine 1:10 GO Follows For: c/o lethargic, body aches, mild sob , clearing throat alot- Denies fever, sob or wheezing He  says he feels this way every spring when the maple trees are pollinating. Nonspecific symptoms. He isn't sure he is getting a cold. Chest feels a little congested.  ROS-see HPI Constitutional:   No-   weight loss, night sweats, fevers, chills,+ fatigue, lassitude. HEENT:   No-  headaches, difficulty swallowing, tooth/dental problems, sore throat,       No-  sneezing, itching, ear ache,+ nasal congestion, post nasal drip,  CV:  No-   chest pain, orthopnea, PND, swelling in lower extremities, anasarca,  dizziness, palpitations Resp: No-   shortness of breath with exertion or at rest.                productive cough,  No non-productive cough,  No- coughing up of blood.              No-   change in color of mucus.  No- wheezing.   Skin: No-   rash or lesions. GI:  No-   heartburn, indigestion, abdominal pain, nausea, vomiting,  GU:  MS:  No-   joint pain or swelling.  Neuro-     nothing unusual Psych:  No- change in mood or affect. No depression or anxiety.  No memory loss.  OBJ- Physical Exam General- Alert, Oriented, Affect-appropriate, Distress- none acute Skin- rash-none, lesions- none, excoriation- none Lymphadenopathy- none Head- atraumatic            Eyes- Gross vision intact, PERRLA, conjunctivae and secretions clear            Ears- Hearing aid right ear            Nose- +turbinate edema,  no-mucus, polyps, erosion, perforation,  +External nose is  deviated to right            Throat- Mallampati II-III , mucosa clear-not red , drainage- none, tonsils- atrophic Neck- flexible , trachea midline, no stridor , thyroid nl, carotid no bruit Chest - symmetrical excursion , unlabored           Heart/CV- RRR , no murmur , no gallop  , no rub, nl s1 s2                           - JVD- none , edema- none, stasis changes- none, varices- none           Lung- clear, wheeze- none, cough- none , dullness-none, rub- none           Chest wall-  Abd-  Br/ Gen/ Rectal- Not done, not  indicated Extrem- cyanosis- none, clubbing, none, atrophy- none, strength- nl Neuro- grossly intact to observation

## 2015-07-24 NOTE — Assessment & Plan Note (Signed)
We discussed tentative plans for me to retire in a year, ending the allergy program here. He can stop allergy shots for observation at any time but currently indicates he will continue through the next year. Mild nonspecific exacerbation now. Plan-Depo-Medrol

## 2015-08-05 DIAGNOSIS — J309 Allergic rhinitis, unspecified: Secondary | ICD-10-CM | POA: Diagnosis not present

## 2015-11-11 ENCOUNTER — Telehealth: Payer: Self-pay | Admitting: Internal Medicine

## 2015-11-11 MED ORDER — FLUTICASONE-SALMETEROL 250-50 MCG/DOSE IN AEPB: INHALATION_SPRAY | RESPIRATORY_TRACT | Status: DC

## 2015-11-11 NOTE — Telephone Encounter (Signed)
Called spoke with pt. Needs advair refilled. I have done so. Nothing further needed

## 2015-11-18 ENCOUNTER — Encounter: Payer: Self-pay | Admitting: Allergy and Immunology

## 2015-11-18 ENCOUNTER — Ambulatory Visit (INDEPENDENT_AMBULATORY_CARE_PROVIDER_SITE_OTHER): Payer: Federal, State, Local not specified - PPO | Admitting: Allergy and Immunology

## 2015-11-18 VITALS — BP 112/84 | HR 91 | Temp 98.5°F | Resp 16 | Ht 69.0 in | Wt 160.0 lb

## 2015-11-18 DIAGNOSIS — J309 Allergic rhinitis, unspecified: Secondary | ICD-10-CM

## 2015-11-18 DIAGNOSIS — H1010 Acute atopic conjunctivitis, unspecified eye: Secondary | ICD-10-CM

## 2015-11-18 DIAGNOSIS — J4541 Moderate persistent asthma with (acute) exacerbation: Secondary | ICD-10-CM

## 2015-11-18 DIAGNOSIS — B370 Candidal stomatitis: Secondary | ICD-10-CM | POA: Diagnosis not present

## 2015-11-18 DIAGNOSIS — J387 Other diseases of larynx: Secondary | ICD-10-CM | POA: Diagnosis not present

## 2015-11-18 DIAGNOSIS — K219 Gastro-esophageal reflux disease without esophagitis: Secondary | ICD-10-CM

## 2015-11-18 MED ORDER — METHYLPREDNISOLONE ACETATE 80 MG/ML IJ SUSP
80.0000 mg | Freq: Once | INTRAMUSCULAR | Status: AC
  Administered 2015-11-18: 80 mg via INTRAMUSCULAR

## 2015-11-18 MED ORDER — MONTELUKAST SODIUM 10 MG PO TABS: 10.0000 mg | ORAL_TABLET | Freq: Every day | ORAL | 1 refills | Status: DC

## 2015-11-18 MED ORDER — ALBUTEROL SULFATE HFA 108 (90 BASE) MCG/ACT IN AERS: 2.0000 | INHALATION_SPRAY | Freq: Four times a day (QID) | RESPIRATORY_TRACT | 0 refills | Status: DC | PRN

## 2015-11-18 MED ORDER — ALBUTEROL SULFATE HFA 108 (90 BASE) MCG/ACT IN AERS: INHALATION_SPRAY | RESPIRATORY_TRACT | 0 refills | Status: DC

## 2015-11-18 MED ORDER — FLUCONAZOLE 150 MG PO TABS: 150.0000 mg | ORAL_TABLET | Freq: Once | ORAL | 0 refills | Status: AC

## 2015-11-18 MED ORDER — MONTELUKAST SODIUM 10 MG PO TABS: 10.0000 mg | ORAL_TABLET | Freq: Every day | ORAL | 0 refills | Status: DC

## 2015-11-18 MED ORDER — BUDESONIDE-FORMOTEROL FUMARATE 160-4.5 MCG/ACT IN AERO: 2.0000 | INHALATION_SPRAY | Freq: Two times a day (BID) | RESPIRATORY_TRACT | 1 refills | Status: DC

## 2015-11-18 MED ORDER — BUDESONIDE-FORMOTEROL FUMARATE 160-4.5 MCG/ACT IN AERO: 2.0000 | INHALATION_SPRAY | Freq: Two times a day (BID) | RESPIRATORY_TRACT | 0 refills | Status: DC

## 2015-11-18 NOTE — Progress Notes (Signed)
Dear Dr. Maple Hudson,  Thank you for referring Kevin [name] to the [facility] on 11/18/2015.   Below is a summation of this patient's evaluation and recommendations.  Thank you for your referral. I will keep you informed about this patient's response to treatment.   If you have any questions please to do hesitate to contact me.   Sincerely,  Jessica Priest, MD  [facility]   ______________________________________________________________________    NEW PATIENT NOTE  Referring Provider: Waymon Budge, MD Primary Provider: Milana Obey, MD Date of office visit: 11/18/2015    Subjective:   Chief Complaint:  Kevin [name] (DOB: 11-08-1955) is a 60 y.o. male with a chief complaint of Asthma (once a year has gotten steriod shots)  who presents to the clinic on 11/18/2015 with the following problems:  HPI: [name] presents to this clinic in evaluation of allergies and asthma. He has had this condition since early in life and has been under the care of Dr. Colvin Caroli, Dr. Andria Meuse, and most recently Dr. Maple Hudson. He's been receiving immunotherapy for greater than 50 years.  Harjot describes an issue with shortness of breath and chest tightness and slight cough especially presenting itself in the morning. This is been especially active over the course of the past 3 weeks. This is occurring even though he consistently uses his Advair 250 one inhalation twice a day and a short acting bronchodilator 2 inhalations twice a day on a consistent basis. He has been utilizing this plan for years. He does not really exercise to any large degree but if he does decide to walk he can do so for 5 miles without any problem. He does feel as though he gets episodes of prolonged chest cold throughout the year. It does not sound as though he has required a systemic steroid to treat this condition in years. His last chest x-ray was  greater than 10 years ago   Kevin [name] also describes an issue with his upper airways with sneezing and nasal congestion and some itchy eyes on occasion especially following exposure to pollen especially maple and ragweed. This occurs even though he continues to use a antihistamine and occasional nasal cromolyn.  [name] also describes issues with constant drainage and throat clearing and intermittent raspy voice. He needs to cough occasionally to clear out his voice. He has vocal cord fatigue when speaking at church and ends up developing very raspy voice with lots of throat clearing. He does have a history of reflux if he eats spicy food. For instance if he eats pizza he'll get heartburn up into his mid chest for which he will take Tums. This occurs about 5 times per month. He has 2 cups of coffee per day and two Mountain dews per day and does not consume any chocolate and does not consume any alcohol.  Past Medical History:  Diagnosis Date  . ALLERGIC RHINITIS   . Asthma   . Dyslipidemia   . Family history of heart disease   . Hepatitis    3rd Grade  . History of insomnia   . History of nuclear stress test 08/2007    negative bruce myoview  . History of palpitations     Past Surgical History:  Procedure Laterality Date  . NASAL SEPTOPLASTY W/ TURBINOPLASTY  2000   deviated septum  . Sleep Study  07/11/2005   AHI during total sleep 3.68/hr, during  REM sleep 5.52/hr  . TONSILLECTOMY    . TRANSTHORACIC ECHOCARDIOGRAM  09/13/2007   EF=>55%, borderline asymmetric LVH; mild MR/TR, normal RVSP; AV mildly sclerotic, trace AV regurg & trave pulm vavle regurg  . varicocoel        Medication List      albuterol 108 (90 Base) MCG/ACT inhaler Commonly known as:  PROAIR HFA USE 2 INHALATIONS ORALLY 4 TIMES DAILY AS NEEDED   ALPRAZolam 0.25 MG tablet Commonly known as:  XANAX Take 0.25 mg by mouth as needed.   aspirin 81 MG tablet Take 81 mg by mouth daily.   fexofenadine 180 MG  tablet Commonly known as:  ALLEGRA Take 180 mg by mouth daily.   Fluticasone-Salmeterol 250-50 MCG/DOSE Aepb Commonly known as:  ADVAIR DISKUS USE 1 INHALATION ORALLY    INTO THE LUNGS TWO TIMES A DAY   NASALCROM 5.2 MG/ACT nasal spray Generic drug:  cromolyn Place 1 spray into both nostrils 4 (four) times daily.   polycarbophil 625 MG tablet Commonly known as:  FIBERCON Take 625 mg by mouth daily.   simvastatin 20 MG tablet Commonly known as:  ZOCOR Take 1 tablet (20 mg total) by mouth at bedtime.       No Known Allergies  Review of systems negative except as noted in HPI / PMHx or noted below:  Review of Systems  Constitutional: Negative.   HENT: Negative.   Eyes: Negative.   Respiratory: Negative.   Cardiovascular: Negative.   Gastrointestinal: Negative.   Genitourinary: Negative.   Musculoskeletal: Negative.   Skin: Negative.   Neurological: Negative.   Endo/Heme/Allergies: Negative.   Psychiatric/Behavioral: Negative.     Family History  Problem Relation Age of Onset  . Heart attack Father 33    also CHF & lung problems  . Heart attack Brother 55  . Heart attack Brother 55  . Heart attack Maternal Grandmother   . Thyroid cancer Maternal Grandfather   . Allergic rhinitis Neg Hx   . Angioedema Neg Hx   . Asthma Neg Hx   . Eczema Neg Hx   . Immunodeficiency Neg Hx     Social History   Social History  . Marital status: Married    Spouse name: N/A  . Number of children: 2  . Years of education: N/A   Occupational History  . 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 Smoking status: Never Smoker  . Smokeless tobacco: Never Used  . Alcohol use No  . Drug use: No  . Sexual activity: Not on file   Other Topics Concern  . Not on file   Social History Narrative  . No narrative on file    Environmental and Social history  Lives in a house with a dry environment, a dog located inside the household for another week, no carpeting  in the bedroom, plastic on the bed and pillow, and no smokers located inside the household.   Objective:   Vitals:   11/18/15 0843  BP: 112/84  Pulse: 91  Resp: 16  Temp: 98.5 F (36.9 C)   Height: 5\' 9"  (175.3 cm) Weight: 160 lb (72.6 kg)  Physical Exam  Constitutional: He is well-developed, well-nourished, and in no distress.  HENT:  Head: Normocephalic.  Right Ear: Tympanic membrane, external ear and ear canal normal.  Left Ear: Tympanic membrane, external ear and ear canal normal.  Nose: Mucosal edema present. No rhinorrhea.  Mouth/Throat: Uvula is midline and mucous membranes are normal.  Oropharyngeal exudate (Thrush) present.  Deviated septum left or right, hearing aids  Eyes: Conjunctivae are normal.  Neck: Trachea normal. No tracheal tenderness present. No tracheal deviation present. No thyromegaly present.  Cardiovascular: Normal rate, regular rhythm, S1 normal, S2 normal and normal heart sounds.   No murmur heard. Pulmonary/Chest: Breath sounds normal. No stridor. No respiratory distress. He has no wheezes. He has no rales.  Musculoskeletal: He exhibits no edema.  Lymphadenopathy:       Head (right side): No tonsillar adenopathy present.       Head (left side): No tonsillar adenopathy present.    He has no cervical adenopathy.  Neurological: He is alert. Gait normal.  Skin: No rash noted. He is not diaphoretic. No erythema. Nails show no clubbing.  Psychiatric: Mood and affect normal.     Diagnostics: Allergy skin tests were  not performed secondary to the recent administration of an antihistamine.   Spirometry was performed and demonstrated an FEV1 of 4.55 @ 134 % of predicted.Following the administration of nebulized albuterol his FEV1 did not change significantly.   Oxygen saturation on room air at rest was 98%  The patient had an Asthma Control Test with the following results:  .     Assessment and Plan:    1. Asthma, not well controlled, moderate  persistent, with acute exacerbation   2. Allergic rhinoconjunctivitis   3. LPRD (laryngopharyngeal reflux disease)   4. Thrush     1. Allergen avoidance measures?  2. Treat and prevent inflammation:   A. Symbicort 160 - 2 inhalations twice a day with spacer. Rinse mouth after use.  B. OTC Rhinocort one spray each nostril once a day. Coupon. Sample.  C. montelukast 10 mg one tablet once a day  D. Depo-Medrol 80 IM delivered in clinic today  3. Treat and prevent reflux:   A. minimize caffeine consumption. Slow taper  B. OTC omeprazole 20 mg 1 tablet in a.m.  C. OTC ranitidine 150 mg 2 tablets in PM  4. Treat thrush:   A. Diflucan 150 mg tablet 1 tablet single dose  5. If needed:   A. ProAir HFA 2 puffs every 4-6 hours  B. nasal saline spray  C. OTC antihistamine  6. Return to clinic in 2 weeks for skin testing without use of antihistamine  Thelton has an inflamed and irritated respiratory tract affecting his upper airway, lower airway, and mid airway most likely secondary to triggers of allergens and reflux for which I'm going to have him utilize a plan to address both these issues as noted above. He does appear to be developing thrush most likely from the dry powder emanating from his Advair Diskus and we'll switch him over to an MDI formulation of a combination drug using a spacing device. As well, I did give him a systemic steroid today to help with the inflammation affecting his respiratory tract. He will use rather aggressive therapy directed against reflux as noted above. I'll regroup with him in approximately 2 weeks for skin testing and will make a determination about further evaluation treatment pending his response and the results of that test.   Jessica Priest, MD [facility]

## 2015-11-18 NOTE — Patient Instructions (Signed)
  1. Allergen avoidance measures?  2. Treat and prevent inflammation:   A. Symbicort 160 - 2 inhalations twice a day with spacer. Rinse mouth after use.  B. OTC Rhinocort one spray each nostril once a day. Coupon. Sample.  C. montelukast 10 mg one tablet once a day  D. Depo-Medrol 80 IM delivered in clinic today  3. Treat and prevent reflux:   A. minimize caffeine consumption. Slow taper  B. OTC omeprazole 20 mg 1 tablet in a.m.  C. OTC ranitidine 150 mg 2 tablets in PM  4. Treat thrush:   A. Diflucan 150 mg tablet 1 tablet single dose  5. If needed:   A. ProAir HFA 2 puffs every 4-6 hours  B. nasal saline spray  C. OTC antihistamine  6. Return to clinic in 2 weeks for skin testing without use of antihistamine

## 2015-12-08 ENCOUNTER — Encounter: Payer: Self-pay | Admitting: Allergy and Immunology

## 2015-12-08 ENCOUNTER — Ambulatory Visit (INDEPENDENT_AMBULATORY_CARE_PROVIDER_SITE_OTHER): Payer: Federal, State, Local not specified - PPO | Admitting: Allergy and Immunology

## 2015-12-08 VITALS — BP 118/74 | HR 76 | Resp 20

## 2015-12-08 DIAGNOSIS — J4540 Moderate persistent asthma, uncomplicated: Secondary | ICD-10-CM

## 2015-12-08 DIAGNOSIS — J309 Allergic rhinitis, unspecified: Secondary | ICD-10-CM

## 2015-12-08 DIAGNOSIS — H1010 Acute atopic conjunctivitis, unspecified eye: Secondary | ICD-10-CM

## 2015-12-08 MED ORDER — RANITIDINE HCL 300 MG PO TABS: 300.0000 mg | ORAL_TABLET | Freq: Every day | ORAL | 5 refills | Status: DC

## 2015-12-08 MED ORDER — OMEPRAZOLE 20 MG PO CPDR: 20.0000 mg | DELAYED_RELEASE_CAPSULE | Freq: Every day | ORAL | 5 refills | Status: DC

## 2015-12-08 NOTE — Progress Notes (Signed)
Kevin [name] presents this clinic to have skin testing performed. Allergy skin testing was performed directed against a screening panel of aeroallergens and foods. He demonstrated hypersensitivity against Southern grasses and ragweed and a weed mix. He will perform allergen avoidance measures as best as possible and continue on his medical therapy established during his initial visit and we will see him back in this clinic in approximately 10 weeks or earlier if there is a problem.

## 2016-02-06 ENCOUNTER — Other Ambulatory Visit: Payer: Self-pay | Admitting: Allergy and Immunology

## 2016-02-09 ENCOUNTER — Ambulatory Visit: Payer: Federal, State, Local not specified - PPO | Admitting: Allergy and Immunology

## 2016-02-16 ENCOUNTER — Encounter (INDEPENDENT_AMBULATORY_CARE_PROVIDER_SITE_OTHER): Payer: Self-pay

## 2016-02-16 ENCOUNTER — Ambulatory Visit (INDEPENDENT_AMBULATORY_CARE_PROVIDER_SITE_OTHER): Payer: Federal, State, Local not specified - PPO | Admitting: Allergy and Immunology

## 2016-02-16 ENCOUNTER — Encounter: Payer: Self-pay | Admitting: Allergy and Immunology

## 2016-02-16 VITALS — BP 110/70 | HR 88 | Resp 16

## 2016-02-16 DIAGNOSIS — Z87898 Personal history of other specified conditions: Secondary | ICD-10-CM

## 2016-02-16 DIAGNOSIS — J309 Allergic rhinitis, unspecified: Secondary | ICD-10-CM

## 2016-02-16 DIAGNOSIS — H1010 Acute atopic conjunctivitis, unspecified eye: Secondary | ICD-10-CM

## 2016-02-16 DIAGNOSIS — K219 Gastro-esophageal reflux disease without esophagitis: Secondary | ICD-10-CM | POA: Diagnosis not present

## 2016-02-16 DIAGNOSIS — J4540 Moderate persistent asthma, uncomplicated: Secondary | ICD-10-CM

## 2016-02-16 NOTE — Progress Notes (Signed)
Follow-up Note  Referring Provider: Gareth MorganKnowlton, Steve, MD Primary Provider: Milana ObeyStephen D Knowlton, MD Date of Office Visit: 02/16/2016  Subjective:   Kevin [name] (DOB: 03/09/1956) is a 60 y.o. male who [facility] on 02/16/2016 in re-evaluation of the following:  HPI: Kevin [name] returns to this clinic in evaluation of his respiratory tract issues including asthma, allergic rhinitis, and LPR. His initial evaluation occurred on 11/18/2015.  He has noticed improvement regarding all of his respiratory tract issues. He rarely uses a short acting bronchodilator. He has not required a systemic steroid to treat an asthma exacerbation. His upper airways are doing better. His throat is improving.  He did contract a recent "cold" but this is improving on its own without any specific therapy.  He complains about being fatigued over the course of the past 6 months. He apparently had a sleep study performed by Dr. Maple HudsonYoung about 10 years ago which was "borderline".    Medication List      albuterol 108 (90 Base) MCG/ACT inhaler Commonly known as:  PROAIR HFA USE 2 INHALATIONS ORALLY 4 TIMES DAILY AS NEEDED   ALPRAZolam 0.25 MG tablet Commonly known as:  XANAX Take 0.25 mg by mouth as needed.   aspirin 81 MG tablet Take 81 mg by mouth daily.   budesonide-formoterol 160-4.5 MCG/ACT inhaler Commonly known as:  SYMBICORT Inhale 2 puffs into the lungs 2 (two) times daily.   fexofenadine 180 MG tablet Commonly known as:  ALLEGRA Take 180 mg by mouth daily.   montelukast 10 MG tablet Commonly known as:  SINGULAIR take 1 tablet at bedtime   NASALCROM 5.2 MG/ACT nasal spray Generic drug:  cromolyn Place 1 spray into both nostrils 4 (four) times daily.   omeprazole 20 MG capsule Commonly known as:  PRILOSEC Take 1 capsule (20 mg total) by mouth daily.   polycarbophil 625 MG tablet Commonly known as:  FIBERCON Take 625 mg by mouth daily.   ranitidine 300 MG  tablet Commonly known as:  ZANTAC Take 1 tablet (300 mg total) by mouth at bedtime.   RHINOCORT ALLERGY 32 MCG/ACT nasal spray Generic drug:  budesonide Place 1 spray into both nostrils daily.   simvastatin 20 MG tablet Commonly known as:  ZOCOR Take 1 tablet (20 mg total) by mouth at bedtime.       Past Medical History:  Diagnosis Date  . ALLERGIC RHINITIS   . Asthma   . Dyslipidemia   . Family history of heart disease   . Hepatitis    3rd Grade  . History of insomnia   . History of nuclear stress test 08/2007    negative bruce myoview  . History of palpitations     Past Surgical History:  Procedure Laterality Date  . NASAL SEPTOPLASTY W/ TURBINOPLASTY  2000   deviated septum  . Sleep Study  07/11/2005   AHI during total sleep 3.68/hr, during REM sleep 5.52/hr  . TONSILLECTOMY    . TRANSTHORACIC ECHOCARDIOGRAM  09/13/2007   EF=>55%, borderline asymmetric LVH; mild MR/TR, normal RVSP; AV mildly sclerotic, trace AV regurg & trave pulm vavle regurg  . varicocoel      No Known Allergies  Review of systems negative except as noted in HPI / PMHx or noted below:  Review of Systems  Constitutional: Negative.   HENT: Negative.   Eyes: Negative.   Respiratory: Negative.   Cardiovascular: Negative.   Gastrointestinal: Negative.   Genitourinary: Negative.   Musculoskeletal:  Negative.   Skin: Negative.   Neurological: Negative.   Endo/Heme/Allergies: Negative.   Psychiatric/Behavioral: Negative.      Objective:   Vitals:   02/16/16 1000  BP: 110/70  Pulse: 88  Resp: 16          Physical Exam  Constitutional: He is well-developed, well-nourished, and in no distress.  HENT:  Head: Normocephalic.  Right Ear: Tympanic membrane, external ear and ear canal normal.  Left Ear: Tympanic membrane, external ear and ear canal normal.  Nose: Nose normal. No mucosal edema or rhinorrhea.  Mouth/Throat: Uvula is midline, oropharynx is clear and moist and mucous  membranes are normal. No oropharyngeal exudate.  Eyes: Conjunctivae are normal.  Neck: Trachea normal. No tracheal tenderness present. No tracheal deviation present. No thyromegaly present.  Cardiovascular: Normal rate, regular rhythm, S1 normal, S2 normal and normal heart sounds.   No murmur heard. Pulmonary/Chest: Breath sounds normal. No stridor. No respiratory distress. He has no wheezes. He has no rales.  Musculoskeletal: He exhibits no edema.  Lymphadenopathy:       Head (right side): No tonsillar adenopathy present.       Head (left side): No tonsillar adenopathy present.    He has no cervical adenopathy.  Neurological: He is alert. Gait normal.  Skin: No rash noted. He is not diaphoretic. No erythema. Nails show no clubbing.  Psychiatric: Mood and affect normal.    Diagnostics:    Spirometry was performed and demonstrated an FEV1 of 4.49 at 136 % of predicted.  Assessment and Plan:   1. Moderate persistent asthma, uncomplicated   2. Allergic rhinoconjunctivitis   3. LPRD (laryngopharyngeal reflux disease)   4. History of fatigue     1. Continue to perform Allergen avoidance measures  2. Continue to Treat and prevent inflammation:   A. Symbicort 160 - 2 inhalations twice a day with spacer. Rinse mouth after use.  B. OTC Rhinocort one spray each nostril once a day. Coupon. Sample.  C. montelukast 10 mg one tablet once a day  3. Continue to Treat and prevent reflux:   A. minimize caffeine consumption. Slow taper  B. OTC omeprazole 20 mg 1 tablet in a.m.  C. OTC ranitidine 150 mg 2 tablets in PM  4. If needed:   A. ProAir HFA 2 puffs every 4-6 hours  B. nasal saline spray  C. OTC antihistamine  5. In evaluation of fatigue perform the following:   A. blood with annual physical - total and free testosterone, TSH, T4, + standard  B. May need repeat sleep study to evaluate for sleep apnea  6. Return to clinic in 12 weeks    7. Obtain fall flu vaccine  Overall  [name] has had a pretty good response to medical therapy which includes anti-inflammatory medications for his respiratory tract and some attention to his reflux and I suspect that at the end of 12 weeks of therapy he will have a excellent response. He does complain about fatigue and I've asked him to obtain blood tests as noted above when he goes for his annual physical with his primary care doctor in investigation of his fatigue. If he still has problems with fatigue and all his blood tests are negative then it may be worthwhile to repeat his sleep study as he did have a "borderline" sleep study over a decade ago. I'll see him back in this clinic in approximately 12 weeks or earlier if there is a problem.  Laurette Schimke, MD [facility]

## 2016-02-16 NOTE — Patient Instructions (Addendum)
  1. Continue to perform Allergen avoidance measures  2. Continue to Treat and prevent inflammation:   A. Symbicort 160 - 2 inhalations twice a day with spacer. Rinse mouth after use.  B. OTC Rhinocort one spray each nostril once a day. Coupon. Sample.  C. montelukast 10 mg one tablet once a day  3. Continue to Treat and prevent reflux:   A. minimize caffeine consumption. Slow taper  B. OTC omeprazole 20 mg 1 tablet in a.m.  C. OTC ranitidine 150 mg 2 tablets in PM  4. If needed:   A. ProAir HFA 2 puffs every 4-6 hours  B. nasal saline spray  C. OTC antihistamine  5. In evaluation of fatigue perform the following:   A. blood with annual physical - total and free testosterone, TSH, T4, + standard  B. May need repeat sleep study to evaluate for sleep apnea  6. Return to clinic in 12 weeks    7. Obtain fall flu vaccine

## 2016-03-23 ENCOUNTER — Ambulatory Visit (INDEPENDENT_AMBULATORY_CARE_PROVIDER_SITE_OTHER): Payer: Federal, State, Local not specified - PPO | Admitting: Internal Medicine

## 2016-03-23 ENCOUNTER — Encounter: Payer: Self-pay | Admitting: Internal Medicine

## 2016-03-23 VITALS — BP 128/84 | HR 85 | Ht 69.0 in | Wt 161.8 lb

## 2016-03-23 DIAGNOSIS — Z8249 Family history of ischemic heart disease and other diseases of the circulatory system: Secondary | ICD-10-CM | POA: Insufficient documentation

## 2016-03-23 DIAGNOSIS — E785 Hyperlipidemia, unspecified: Secondary | ICD-10-CM

## 2016-03-23 NOTE — Progress Notes (Signed)
OFFICE NOTE  Chief Complaint:  Annual follow-up, no complaints  Primary Care Physician: Milana ObeyStephen D Knowlton, MD  HPI:  Kevin [name]  is a 60 year-old gentleman with a history of asthma, dyslipidemia, and a family history of coronary disease. He underwent a cardiac workup in 2009 which was negative for ischemia. He was having palpitations which we felt maybe was related to stress, anxiety or job, or maybe the medications he had been taking. However, those have subsequently gone away. He reports now that he retired this past year and has been busy, however, working on [employer] properties and other physical activities. During that he denies any shortness of breath, chest pain, palpitations, presyncope or syncopal symptoms.  He recently had lambert tori were performed on 02/26/2013. This demonstrated total cholesterol 162, HDL 65, triglycerides 45 and LDL 88. His glucose is 90 creatinine remained 0.86 the rest of his laboratory work is unremarkable. Overall he is doing well and denies any specific symptoms.  Kevin [name] returns today for follow-up. He reports some fatigue which she has perennially in the fall related to seasonal allergies. He denies any worsening chest pain or shortness of breath. He recently had repeat laboratory work which shows a consistently well-controlled lipid profile. Total cholesterol 148, HDL 63, turgor stress 45 and LDL 76. The rest of his laboratory work is within normal limits. He is concerned that some of his fatigue may be related to low testosterone. He's never had that checked. He does see Dr. Patsi Searsannenbaum who follows him for an elevated PSA in the past however it is since normalized and was 0.7 on recent laboratory work.  Kevin [name] returns today for follow-up. This is an annual visit he seems to be doing fairly well. He occasionally has some problems with shortness of breath related to asthma. He denies any chest pain. Cholesterol is been very well controlled - recent  labs from 03/05/2015 demonstrated total cholesterol 184, HDL 66, triglycerides 74 and LDL 103. This indicates about a 30 point increase in LDL from prior labs. He's quite concerned about this but I tried to reassure him that may be due to less activity or dietary changes, but I would not recommend medicine changes at this time.  03/23/2016  Kevin [name] was seen today for an annual visit. He has no new complaints. He recently saw his PCP and had labwork. His LDL is improved to 88 with TC of 155. He denies any dietary or medication changes. He denies chest pain or dyspnea. He does have seasonal allergies and is seeing an allergist.  PMHx:  Past Medical History:  Diagnosis Date  . ALLERGIC RHINITIS   . Asthma   . Dyslipidemia   . Family history of heart disease   . Hepatitis    3rd Grade  . History of insomnia   . History of nuclear stress test 08/2007    negative bruce myoview  . History of palpitations     Past Surgical History:  Procedure Laterality Date  . NASAL SEPTOPLASTY W/ TURBINOPLASTY  2000   deviated septum  . Sleep Study  07/11/2005   AHI during total sleep 3.68/hr, during REM sleep 5.52/hr  . TONSILLECTOMY    . TRANSTHORACIC ECHOCARDIOGRAM  09/13/2007   EF=>55%, borderline asymmetric LVH; mild MR/TR, normal RVSP; AV mildly sclerotic, trace AV regurg & trave pulm vavle regurg  . varicocoel      FAMHx:  Family History  Problem Relation Age of Onset  . Heart attack Father  75    also CHF & lung problems  . Heart attack Brother 55  . Heart attack Brother 55  . Heart attack Maternal Grandmother   . Thyroid cancer Maternal Grandfather   . Allergic rhinitis Neg Hx   . Angioedema Neg Hx   . Asthma Neg Hx   . Eczema Neg Hx   . Immunodeficiency Neg Hx     SOCHx:   reports that he has never smoked. He has never used smokeless tobacco. He reports that he does not drink alcohol or use drugs.  ALLERGIES:  No Known Allergies  ROS: Pertinent items noted in HPI and  remainder of comprehensive ROS otherwise negative.  HOME MEDS: Current Outpatient Prescriptions  Medication Sig Dispense Refill  . albuterol (PROAIR HFA) 108 (90 Base) MCG/ACT inhaler USE 2 INHALATIONS ORALLY 4 TIMES DAILY AS NEEDED 1 Inhaler 0  . ALPRAZolam (XANAX) 0.25 MG tablet Take 0.25 mg by mouth as needed.    Marland Kitchen. aspirin 81 MG tablet Take 81 mg by mouth daily.    . budesonide (RHINOCORT ALLERGY) 32 MCG/ACT nasal spray Place 1 spray into both nostrils daily.    . budesonide-formoterol (SYMBICORT) 160-4.5 MCG/ACT inhaler Inhale 2 puffs into the lungs 2 (two) times daily. 3 Inhaler 1  . cromolyn (NASALCROM) 5.2 MG/ACT nasal spray Place 1 spray into both nostrils 4 (four) times daily.    . fexofenadine (ALLEGRA) 180 MG tablet Take 180 mg by mouth daily.    . montelukast (SINGULAIR) 10 MG tablet Take 1 tablet (10 mg total) by mouth at bedtime. 90 tablet 1  . montelukast (SINGULAIR) 10 MG tablet take 1 tablet at bedtime 30 tablet 3  . omeprazole (PRILOSEC) 20 MG capsule Take 1 capsule (20 mg total) by mouth daily. 30 capsule 5  . polycarbophil (FIBERCON) 625 MG tablet Take 625 mg by mouth daily.    . ranitidine (ZANTAC) 300 MG tablet Take 1 tablet (300 mg total) by mouth at bedtime. 30 tablet 5  . simvastatin (ZOCOR) 20 MG tablet Take 1 tablet (20 mg total) by mouth at bedtime. 30 tablet 0   No current facility-administered medications for this visit.     LABS/IMAGING: No results found for this or any previous visit (from the past 48 hour(s)). No results found.  VITALS: BP 128/84   Pulse 85   Ht 5\' 9"  (1.753 m)   Wt 161 lb 12.8 oz (73.4 kg)   BMI 23.89 kg/m   EXAM: General appearance: alert and no distress Neck: no adenopathy, no carotid bruit, no JVD, supple, symmetrical, trachea midline and thyroid not enlarged, symmetric, no tenderness/mass/nodules Lungs: clear to auscultation bilaterally Heart: regular rate and rhythm, S1, S2 normal, no murmur, click, rub or gallop Abdomen:  soft, non-tender; bowel sounds normal; no masses,  no organomegaly Extremities: extremities normal, atraumatic, no cyanosis or edema Pulses: 2+ and symmetric Skin: Skin color, texture, turgor normal. No rashes or lesions Neurologic: Grossly normal  EKG: Sinus rhythm at 85, incomplete RBBB  ASSESSMENT: 1. Dyslipidemia-controlled 2. Family history of premature coronary disease 3. Anxiety 4. Seasonal allergies/asthma  PLAN: 1.   Kevin [name] is doing extremely well. His cholesterol is well-controlled. I recommended he continue to monitor this. No other changes to his medicines today.  Plan to see him back annually or sooner as necessary.  Chrystie NoseKenneth C. Kayleann Mccaffery, MD, [facility] Attending Cardiologist [facility]  Chrystie NoseKenneth C Lacheryl [name] 03/23/2016, 9:10 PM

## 2016-03-23 NOTE — Patient Instructions (Signed)
Medication Instructions:  Your physician recommends that you continue on your current medications as directed. Please refer to the Current Medication list given to you today.  Labwork: None  Testing/Procedures: None   Follow-Up: Your physician wants you to follow-up in: 12 MONTHS WITH DR HILTY. You will receive a reminder letter in the mail two months in advance. If you don't receive a letter, please call our office to schedule the follow-up appointment.  Any Other Special Instructions Will Be Listed Below (If Applicable).     If you need a refill on your cardiac medications before your next appointment, please call your pharmacy.  

## 2016-05-10 ENCOUNTER — Encounter: Payer: Self-pay | Admitting: Allergy and Immunology

## 2016-05-10 ENCOUNTER — Ambulatory Visit (INDEPENDENT_AMBULATORY_CARE_PROVIDER_SITE_OTHER): Payer: Federal, State, Local not specified - PPO | Admitting: Allergy and Immunology

## 2016-05-10 ENCOUNTER — Encounter (INDEPENDENT_AMBULATORY_CARE_PROVIDER_SITE_OTHER): Payer: Self-pay

## 2016-05-10 VITALS — BP 124/70 | HR 72 | Resp 24

## 2016-05-10 DIAGNOSIS — K219 Gastro-esophageal reflux disease without esophagitis: Secondary | ICD-10-CM | POA: Diagnosis not present

## 2016-05-10 DIAGNOSIS — J3089 Other allergic rhinitis: Secondary | ICD-10-CM

## 2016-05-10 DIAGNOSIS — Z87898 Personal history of other specified conditions: Secondary | ICD-10-CM | POA: Diagnosis not present

## 2016-05-10 DIAGNOSIS — J4540 Moderate persistent asthma, uncomplicated: Secondary | ICD-10-CM | POA: Diagnosis not present

## 2016-05-10 NOTE — Patient Instructions (Signed)
  1. Continue to perform Allergen avoidance measures  2. Continue to Treat and prevent inflammation:   A. Symbicort 160 - 2 inhalations 1-2 times per day with spacer.  B. OTC Rhinocort one spray each nostril once 3-7 times per week  C. montelukast 10 mg one tablet once a day  3. Continue to Treat and prevent reflux:   A. minimize caffeine consumption.   B. OTC omeprazole 20 mg 1 tablet in a.m.  C. OTC ranitidine 150 mg 2 tablets in PM  4. If needed:   A. ProAir HFA 2 puffs every 4-6 hours  B. nasal saline spray  C. OTC antihistamine  5. Return to clinic in 6 months or earlier if problem

## 2016-05-10 NOTE — Progress Notes (Signed)
Follow-up Note  Referring Provider: Gareth MorganKnowlton, Steve, MD Primary Provider: Milana ObeyStephen D Knowlton, MD Date of Office Visit: 05/10/2016  Subjective:   Kevin [name] (DOB: 08/02/1955) is a 61 y.o. male who [facility] in re-evaluation of the following:  HPI: [name] returns to this clinic in reevaluation of his asthma and allergic rhinitis and LPR and fatigue. I last saw him in this clinic in October 2017.  During the interval he has done wonderful regarding his respiratory tract. He rarely uses a short acting bronchodilator and can exert himself without any difficulty. He has not required a systemic steroid to treat a exacerbation of asthma.  In addition, his nose has really been doing quite well and he has not required an antibiotic to treat an episode of sinusitis.  His reflux is under excellent control and he's had very little issue with his throat at this point in time.  In evaluation of fatigue he did have the blood tests performed that I suggested during his last visit by his primary care doctor. It does appear as though his testosterone level and his thyroid function is normal. He's obviously much better regarding this fatigue at this point in time.  He has had administration of the flu vaccine this year.  Allergies as of 05/10/2016   No Known Allergies     Medication List      albuterol 108 (90 Base) MCG/ACT inhaler Commonly known as:  PROAIR HFA USE 2 INHALATIONS ORALLY 4 TIMES DAILY AS NEEDED   ALPRAZolam 0.25 MG tablet Commonly known as:  XANAX Take 0.25 mg by mouth as needed.   aspirin 81 MG tablet Take 81 mg by mouth daily.   budesonide-formoterol 160-4.5 MCG/ACT inhaler Commonly known as:  SYMBICORT Inhale 2 puffs into the lungs 2 (two) times daily.   fexofenadine 180 MG tablet Commonly known as:  ALLEGRA Take 180 mg by mouth daily.   guaiFENesin 600 MG 12 hr tablet Commonly known as:  MUCINEX Take by mouth 2 (two)  times daily.   montelukast 10 MG tablet Commonly known as:  SINGULAIR Take 1 tablet (10 mg total) by mouth at bedtime.   montelukast 10 MG tablet Commonly known as:  SINGULAIR take 1 tablet at bedtime   NASALCROM 5.2 MG/ACT nasal spray Generic drug:  cromolyn Place 1 spray into both nostrils 4 (four) times daily.   omeprazole 20 MG capsule Commonly known as:  PRILOSEC Take 1 capsule (20 mg total) by mouth daily.   OSTEO BI-FLEX REGULAR STRENGTH PO Take by mouth.   polycarbophil 625 MG tablet Commonly known as:  FIBERCON Take 625 mg by mouth daily.   ranitidine 300 MG tablet Commonly known as:  ZANTAC Take 1 tablet (300 mg total) by mouth at bedtime.   RHINOCORT ALLERGY 32 MCG/ACT nasal spray Generic drug:  budesonide Place 1 spray into both nostrils daily.   simvastatin 20 MG tablet Commonly known as:  ZOCOR Take 1 tablet (20 mg total) by mouth at bedtime.       Past Medical History:  Diagnosis Date  . ALLERGIC RHINITIS   . Asthma   . Dyslipidemia   . Family history of heart disease   . Hepatitis    3rd Grade  . History of insomnia   . History of nuclear stress test 08/2007    negative bruce myoview  . History of palpitations     Past Surgical History:  Procedure Laterality Date  .  NASAL SEPTOPLASTY W/ TURBINOPLASTY  2000   deviated septum  . Sleep Study  07/11/2005   AHI during total sleep 3.68/hr, during REM sleep 5.52/hr  . TONSILLECTOMY    . TRANSTHORACIC ECHOCARDIOGRAM  09/13/2007   EF=>55%, borderline asymmetric LVH; mild MR/TR, normal RVSP; AV mildly sclerotic, trace AV regurg & trave pulm vavle regurg  . varicocoel      Review of systems negative except as noted in HPI / PMHx or noted below:  Review of Systems  Constitutional: Negative.   HENT: Negative.   Eyes: Negative.   Respiratory: Negative.   Cardiovascular: Negative.   Gastrointestinal: Negative.   Genitourinary: Negative.   Musculoskeletal: Negative.   Skin: Negative.     Neurological: Negative.   Endo/Heme/Allergies: Negative.   Psychiatric/Behavioral: Negative.      Objective:   Vitals:   05/10/16 0951  BP: 124/70  Pulse: 72  Resp: (!) 24          Physical Exam  Constitutional: He is well-developed, well-nourished, and in no distress.  HENT:  Head: Normocephalic.  Right Ear: Tympanic membrane, external ear and ear canal normal.  Left Ear: Tympanic membrane, external ear and ear canal normal.  Nose: Nose normal. No mucosal edema or rhinorrhea.  Mouth/Throat: Uvula is midline, oropharynx is clear and moist and mucous membranes are normal. No oropharyngeal exudate.  Eyes: Conjunctivae are normal.  Neck: Trachea normal. No tracheal tenderness present. No tracheal deviation present. No thyromegaly present.  Cardiovascular: Normal rate, regular rhythm, S1 normal, S2 normal and normal heart sounds.   No murmur heard. Pulmonary/Chest: Breath sounds normal. No stridor. No respiratory distress. He has no wheezes. He has no rales.  Musculoskeletal: He exhibits no edema.  Lymphadenopathy:       Head (right side): No tonsillar adenopathy present.       Head (left side): No tonsillar adenopathy present.    He has no cervical adenopathy.  Neurological: He is alert. Gait normal.  Skin: No rash noted. He is not diaphoretic. No erythema. Nails show no clubbing.  Psychiatric: Mood and affect normal.    Diagnostics:    Spirometry was performed and demonstrated an FEV1 of 4.51 at 136 % of predicted.   Assessment and Plan:   1. Moderate persistent asthma, uncomplicated   2. Other allergic rhinitis   3. LPRD (laryngopharyngeal reflux disease)   4. History of fatigue     1. Continue to perform Allergen avoidance measures  2. Continue to Treat and prevent inflammation:   A. Symbicort 160 - 2 inhalations 1-2 times per day with spacer.  B. OTC Rhinocort one spray each nostril once 3-7 times per week  C. montelukast 10 mg one tablet once a day  3.  Continue to Treat and prevent reflux:   A. minimize caffeine consumption.   B. OTC omeprazole 20 mg 1 tablet in a.m.  C. OTC ranitidine 150 mg 2 tablets in PM  4. If needed:   A. ProAir HFA 2 puffs every 4-6 hours  B. nasal saline spray  C. OTC antihistamine  5. Return to clinic in 6 months or earlier if problem  Marvis is doing quite well. I did have a discussion with him today about the option of lowering his dose of Symbicort to 1 time per day and lowering his dose of Rhinocort to a few times per week given the fact that he is doing so well regarding both his upper and lower airways. As well, he does not require any  further evaluation for fatigue at this point as this has basically resolved. He will continue to use anti-inflammatory agents for his respiratory tract and treatment directed against reflux is specified above and I'll see him back in this clinic in 6 months or earlier if there is a problem.  Laurette Schimke, MD [facility]

## 2016-06-03 ENCOUNTER — Other Ambulatory Visit: Payer: Self-pay | Admitting: Allergy and Immunology

## 2016-06-30 ENCOUNTER — Other Ambulatory Visit: Payer: Self-pay

## 2016-06-30 MED ORDER — BUDESONIDE-FORMOTEROL FUMARATE 160-4.5 MCG/ACT IN AERO: 2.0000 | INHALATION_SPRAY | Freq: Two times a day (BID) | RESPIRATORY_TRACT | 1 refills | Status: DC

## 2016-07-16 ENCOUNTER — Other Ambulatory Visit: Payer: Self-pay | Admitting: Allergy and Immunology

## 2016-12-14 ENCOUNTER — Other Ambulatory Visit: Payer: Self-pay | Admitting: Allergy and Immunology

## 2016-12-16 ENCOUNTER — Other Ambulatory Visit: Payer: Self-pay | Admitting: Allergy and Immunology

## 2016-12-21 ENCOUNTER — Other Ambulatory Visit: Payer: Self-pay | Admitting: Allergy and Immunology

## 2017-01-14 ENCOUNTER — Other Ambulatory Visit: Payer: Self-pay | Admitting: Allergy and Immunology

## 2017-01-27 ENCOUNTER — Other Ambulatory Visit: Payer: Self-pay | Admitting: Allergy and Immunology

## 2017-02-14 ENCOUNTER — Encounter: Payer: Self-pay | Admitting: Allergy and Immunology

## 2017-02-14 ENCOUNTER — Ambulatory Visit (INDEPENDENT_AMBULATORY_CARE_PROVIDER_SITE_OTHER): Payer: Federal, State, Local not specified - PPO | Admitting: Allergy and Immunology

## 2017-02-14 VITALS — BP 118/74 | HR 73 | Resp 17 | Ht 69.0 in | Wt 170.0 lb

## 2017-02-14 DIAGNOSIS — B370 Candidal stomatitis: Secondary | ICD-10-CM

## 2017-02-14 DIAGNOSIS — K219 Gastro-esophageal reflux disease without esophagitis: Secondary | ICD-10-CM

## 2017-02-14 DIAGNOSIS — J4541 Moderate persistent asthma with (acute) exacerbation: Secondary | ICD-10-CM

## 2017-02-14 DIAGNOSIS — J3089 Other allergic rhinitis: Secondary | ICD-10-CM | POA: Diagnosis not present

## 2017-02-14 MED ORDER — RANITIDINE HCL 300 MG PO TABS: 300.0000 mg | ORAL_TABLET | Freq: Every day | ORAL | 1 refills | Status: DC

## 2017-02-14 MED ORDER — MONTELUKAST SODIUM 10 MG PO TABS: 10.0000 mg | ORAL_TABLET | Freq: Every day | ORAL | 1 refills | Status: DC

## 2017-02-14 MED ORDER — FLUCONAZOLE 150 MG PO TABS: 150.0000 mg | ORAL_TABLET | ORAL | 0 refills | Status: AC

## 2017-02-14 MED ORDER — METHYLPREDNISOLONE ACETATE 80 MG/ML IJ SUSP
80.0000 mg | Freq: Once | INTRAMUSCULAR | Status: AC
  Administered 2017-02-14: 80 mg via INTRAMUSCULAR

## 2017-02-14 MED ORDER — BUDESONIDE-FORMOTEROL FUMARATE 160-4.5 MCG/ACT IN AERO: 2.0000 | INHALATION_SPRAY | Freq: Two times a day (BID) | RESPIRATORY_TRACT | 1 refills | Status: DC

## 2017-02-14 MED ORDER — OMEPRAZOLE 20 MG PO CPDR: 20.0000 mg | DELAYED_RELEASE_CAPSULE | Freq: Every day | ORAL | 1 refills | Status: DC

## 2017-02-14 NOTE — Patient Instructions (Signed)
  1. Continue to perform Allergen avoidance measures  2. Continue to Treat and prevent inflammation:   A. Symbicort 160 - 2 inhalations 1-2 times per day with spacer.  B. OTC Rhinocort one spray each nostril once 3-7 times per week  C. montelukast 10 mg one tablet once a day  D. Depo-Medrol 80 IM delivered in clinic today  3. Continue to Treat and prevent reflux:   A. minimize caffeine consumption.   B. OTC omeprazole 20 mg 1 tablet in a.m.  C. OTC ranitidine 150 mg 2 tablets in PM  4. Treat fungal overgrowth in mouth:   A. Diflucan 150 mg tablet today and repeat weekly for 3 doses total  5. If needed:   A. ProAir HFA 2 puffs every 4-6 hours  B. nasal saline spray  C. OTC antihistamine  6. Return to clinic in 6 months or earlier if problem  7. Immunotherapy?

## 2017-02-14 NOTE — Progress Notes (Signed)
Follow-up Note  Referring Provider: Gareth Morgan, MD Primary Provider: Gareth Morgan, MD Date of Office Visit: 02/14/2017  Subjective:   Kevin [name] (DOB: February 06, 1956) is a 61 y.o. male who [facility] in re-evaluation of the following:  HPI: Kevin [name] returns to this clinic in reevaluation of his asthma and allergic rhinitis and LPR. I last saw him in this clinic in January 2018.  Overall he has had an excellent year without the need for systemic steroid or an antibiotic to treat any type of respiratory tract issue. He continues to use anti-inflammatory medications on a regular basis and rarely uses a short acting bronchodilator and can exercise without any problem.  Likewise, his reflux has been under pretty good control on his current medical plan and he has not really been having any problems with his throat.  Unfortunately, about 3 weeks ago while working outdoors and having exposure to ragweed he has been developing some issues with chest tightness and a little bit of raspy voice and some throat clearing and drainage. He has not had a tremendous amount of shortness of breath or wheezing and he has not had any significant issues with anosmia or ugly nasal discharge or recurrent fevers.  Allergies as of 02/14/2017   No Known Allergies     Medication List      albuterol 108 (90 Base) MCG/ACT inhaler Commonly known as:  PROAIR HFA USE 2 INHALATIONS ORALLY 4 TIMES DAILY AS NEEDED   ALPRAZolam 0.25 MG tablet Commonly known as:  XANAX Take 0.25 mg by mouth as needed.   aspirin 81 MG tablet Take 81 mg by mouth daily.   budesonide-formoterol 160-4.5 MCG/ACT inhaler Commonly known as:  SYMBICORT Inhale 2 puffs into the lungs 2 (two) times daily.   fexofenadine 180 MG tablet Commonly known as:  ALLEGRA Take 180 mg by mouth daily.   guaiFENesin 600 MG 12 hr tablet Commonly known as:  MUCINEX Take by mouth 2 (two) times daily.   montelukast 10 MG tablet Commonly known as:  SINGULAIR Take 1 tablet (10 mg total) by mouth at bedtime.   montelukast 10 MG tablet Commonly known as:  SINGULAIR take 1 tablet by mouth at bedtime   NASALCROM 5.2 MG/ACT nasal spray Generic drug:  cromolyn Place 1 spray into both nostrils 4 (four) times daily.   omeprazole 20 MG capsule Commonly known as:  PRILOSEC take 1 capsule once daily   OSTEO BI-FLEX REGULAR STRENGTH PO Take by mouth.   polycarbophil 625 MG tablet Commonly known as:  FIBERCON Take 625 mg by mouth daily.   ranitidine 300 MG tablet Commonly known as:  ZANTAC take 1 tablet at bedtime   RHINOCORT ALLERGY 32 MCG/ACT nasal spray Generic drug:  budesonide Place 1 spray into both nostrils daily.   simvastatin 20 MG tablet Commonly known as:  ZOCOR Take 1 tablet (20 mg total) by mouth at bedtime.       Past Medical History:  Diagnosis Date  . ALLERGIC RHINITIS   . Asthma   . Dyslipidemia   . Family history of heart disease   . Hepatitis    3rd Grade  . History of insomnia   . History of nuclear stress test 08/2007    negative bruce myoview  . History of palpitations     Past Surgical History:  Procedure Laterality Date  . NASAL SEPTOPLASTY W/ TURBINOPLASTY  2000   deviated septum  . Sleep Study  07/11/2005   AHI during total sleep 3.68/hr, during REM sleep 5.52/hr  . TONSILLECTOMY    . TRANSTHORACIC ECHOCARDIOGRAM  09/13/2007   EF=>55%, borderline asymmetric LVH; mild MR/TR, normal RVSP; AV mildly sclerotic, trace AV regurg & trave pulm vavle regurg  . varicocoel      Review of systems negative except as noted in HPI / PMHx or noted below:  Review of Systems  Constitutional: Negative.   HENT: Negative.   Eyes: Negative.   Respiratory: Negative.   Cardiovascular: Negative.   Gastrointestinal: Negative.   Genitourinary: Negative.   Musculoskeletal: Negative.   Skin: Negative.   Neurological: Negative.   Endo/Heme/Allergies:  Negative.   Psychiatric/Behavioral: Negative.      Objective:   Vitals:   02/14/17 1621  BP: 118/74  Pulse: 73  Resp: 17  SpO2: 98%   Height: 5\' 9"  (175.3 cm)  Weight: 170 lb (77.1 kg)   Physical Exam  Constitutional: He is well-developed, well-nourished, and in no distress.  HENT:  Head: Normocephalic.  Right Ear: External ear normal.  Left Ear: External ear normal.  Nose: Nose normal. No mucosal edema or rhinorrhea.  Mouth/Throat: Uvula is midline, oropharynx is clear and moist and mucous membranes are normal. No oropharyngeal exudate (Thrush).  Bilateral hearing aids  Eyes: Conjunctivae are normal.  Neck: Trachea normal. No tracheal tenderness present. No tracheal deviation present. No thyromegaly present.  Cardiovascular: Normal rate, regular rhythm, S1 normal, S2 normal and normal heart sounds.   No murmur heard. Pulmonary/Chest: Breath sounds normal. No stridor. No respiratory distress. He has no wheezes. He has no rales.  Musculoskeletal: He exhibits no edema.  Lymphadenopathy:       Head (right side): No tonsillar adenopathy present.       Head (left side): No tonsillar adenopathy present.    He has no cervical adenopathy.  Neurological: He is alert. Gait normal.  Skin: No rash noted. He is not diaphoretic. No erythema. Nails show no clubbing.  Psychiatric: Mood and affect normal.    Diagnostics:    Spirometry was performed and demonstrated an FEV1 of 4.44 at 132 % of predicted.  The patient had an Asthma Control Test with the following results: ACT Total Score: 22.    Assessment and Plan:   1. Asthma, not well controlled, moderate persistent, with acute exacerbation   2. Other allergic rhinitis   3. LPRD (laryngopharyngeal reflux disease)   4. Thrush     1. Continue to perform Allergen avoidance measures  2. Continue to Treat and prevent inflammation:   A. Symbicort 160 - 2 inhalations 1-2 times per day with spacer.  B. OTC Rhinocort one spray each  nostril once 3-7 times per week  C. montelukast 10 mg one tablet once a day  D. Depo-Medrol 80 IM delivered in clinic today  3. Continue to Treat and prevent reflux:   A. minimize caffeine consumption.   B. OTC omeprazole 20 mg 1 tablet in a.m.  C. OTC ranitidine 150 mg 2 tablets in PM  4. Treat fungal overgrowth in mouth:   A. Diflucan 150 mg tablet today and repeat weekly for 3 doses total  5. If needed:   A. ProAir HFA 2 puffs every 4-6 hours  B. nasal saline spray  C. OTC antihistamine  6. Return to clinic in 6 months or earlier if problem  7. Immunotherapy?  Kevin [name] appears to be having some degree of respiratory tract inflammation most likely secondary to pollen exposure and I will give  him a systemic steroid as noted above and in addition also address the issue with his thrush with Diflucan for the next 3 weeks. If he does well I will see him back in this clinic in 6 months or earlier if there is a problem. He certainly has the option of utilizing immunotherapy for his atopic disease if he does not do well with the plan mentioned above.  Laurette SchimkeEric Nieves Barberi, MD Allergy / Immunology [facility]

## 2017-04-05 ENCOUNTER — Telehealth: Payer: Self-pay

## 2017-04-05 NOTE — Telephone Encounter (Signed)
Pt received a triage letter from DS. I told him that she left early today and would be back tomorrow. Please call him at [phone]

## 2017-04-20 NOTE — Telephone Encounter (Signed)
LMOM for a return call.  

## 2017-04-27 ENCOUNTER — Telehealth: Payer: Self-pay

## 2017-04-27 NOTE — Telephone Encounter (Signed)
Pt wants to be called when we get the March schedule.

## 2017-04-27 NOTE — Telephone Encounter (Signed)
See med list

## 2017-05-16 ENCOUNTER — Telehealth: Payer: Self-pay

## 2017-05-17 NOTE — Telephone Encounter (Addendum)
Gastroenterology Pre-Procedure Review  Request Date: 05/16/2017 Requesting Physician: Dr. Sudie BaileyKnowlton  PATIENT REVIEW QUESTIONS: The patient responded to the following health history questions as indicated:    Pt's last colonoscopy was 08/17/2005 by Dr. Jena Gaussourk  1. Diabetes Melitis: no 2. Joint replacements in the past 12 months: no 3. Major health problems in the past 3 months: no 4. Has an artificial valve or MVP: dx with aortic tricuspid mitral regurgitation 5. Has a defibrillator: no 6. Has been advised in past to take antibiotics in advance of a procedure like teeth cleaning: yes 7. Family history of colon cancer: no  8. Alcohol Use: NO  9. History of sleep apnea: no  10. History of coronary artery or other vascular stents placed within the last 12 months: no 11. History of any prior anesthesia complications: no    MEDICATIONS & ALLERGIES:    Patient reports the following regarding taking any blood thinners:   Plavix? no Aspirin? YES Coumadin? no Brilinta? no Xarelto? no Eliquis? no Pradaxa? no Savaysa? no Effient? no  Patient confirms/reports the following medications:  Current Outpatient Medications  Medication Sig Dispense Refill  . albuterol (PROAIR HFA) 108 (90 Base) MCG/ACT inhaler USE 2 INHALATIONS ORALLY 4 TIMES DAILY AS NEEDED 1 Inhaler 0  . aspirin 81 MG tablet Take 81 mg by mouth daily.    . cromolyn (NASALCROM) 5.2 MG/ACT nasal spray Place 1 spray into both nostrils 4 (four) times daily.    . Glucosamine-Chondroitin (OSTEO BI-FLEX REGULAR STRENGTH PO) Take by mouth.    Marland Kitchen. guaiFENesin (MUCINEX) 600 MG 12 hr tablet Take by mouth 2 (two) times daily.    . montelukast (SINGULAIR) 10 MG tablet Take 1 tablet (10 mg total) by mouth at bedtime. 90 tablet 1  . omeprazole (PRILOSEC) 20 MG capsule Take 1 capsule (20 mg total) by mouth daily. 90 capsule 1  . polycarbophil (FIBERCON) 625 MG tablet Take 625 mg by mouth daily.    . ranitidine (ZANTAC) 300 MG tablet Take 1 tablet  (300 mg total) by mouth at bedtime. 90 tablet 1  . simvastatin (ZOCOR) 20 MG tablet Take 1 tablet (20 mg total) by mouth at bedtime. 30 tablet 0  . budesonide (RHINOCORT ALLERGY) 32 MCG/ACT nasal spray Place 1 spray into both nostrils daily.    . budesonide-formoterol (SYMBICORT) 160-4.5 MCG/ACT inhaler Inhale 2 puffs into the lungs 2 (two) times daily. 3 Inhaler 1  . fexofenadine (ALLEGRA) 180 MG tablet Take 180 mg by mouth daily.     No current facility-administered medications for this visit.     Patient confirms/reports the following allergies:  No Known Allergies  No orders of the defined types were placed in this encounter.   AUTHORIZATION INFORMATION Primary Insurance:   ID #:   Group #:  Pre-Cert / Auth required:  Pre-Cert / Auth #:   Secondary Insurance:  ID #:   Group #:  Pre-Cert / Auth required:  Pre-Cert / Auth #:   SCHEDULE INFORMATION: Procedure has been scheduled as follows:  Date:  07/12/2017                     Time:  8:30 AM Location: [facility] Short Stay  This Gastroenterology Pre-Precedure Review Form is being routed to the following provider(s): R. Roetta SessionsMichael Rourk, MD

## 2017-05-18 NOTE — Telephone Encounter (Signed)
Ok to schedule.

## 2017-05-22 ENCOUNTER — Other Ambulatory Visit: Payer: Self-pay

## 2017-05-22 DIAGNOSIS — Z1211 Encounter for screening for malignant neoplasm of colon: Secondary | ICD-10-CM

## 2017-05-22 MED ORDER — PEG 3350-KCL-NA BICARB-NACL 420 G PO SOLR: 4000.0000 mL | ORAL | 0 refills | Status: DC

## 2017-05-22 NOTE — Telephone Encounter (Signed)
Rx sent to the pharmacy and instructions mailed to pt.  

## 2017-06-05 ENCOUNTER — Other Ambulatory Visit: Payer: Self-pay | Admitting: *Deleted

## 2017-06-05 MED ORDER — BUDESONIDE-FORMOTEROL FUMARATE 160-4.5 MCG/ACT IN AERO: 2.0000 | INHALATION_SPRAY | Freq: Two times a day (BID) | RESPIRATORY_TRACT | 0 refills | Status: DC

## 2017-07-12 ENCOUNTER — Other Ambulatory Visit: Payer: Self-pay

## 2017-07-12 ENCOUNTER — Encounter (HOSPITAL_COMMUNITY): Payer: Self-pay | Admitting: *Deleted

## 2017-07-12 ENCOUNTER — Encounter (HOSPITAL_COMMUNITY)
Admission: RE | Disposition: A | Discharge status: Home / Self Care | Payer: Self-pay | Source: Ambulatory Visit | Attending: Internal Medicine

## 2017-07-12 ENCOUNTER — Ambulatory Visit (HOSPITAL_COMMUNITY)
Admission: RE | Admit: 2017-07-12 | Discharge: 2017-07-12 | Disposition: A | Discharge status: Home / Self Care | Payer: Federal, State, Local not specified - PPO | Source: Ambulatory Visit | Attending: Internal Medicine | Admitting: Internal Medicine

## 2017-07-12 DIAGNOSIS — Z1212 Encounter for screening for malignant neoplasm of rectum: Secondary | ICD-10-CM | POA: Diagnosis not present

## 2017-07-12 DIAGNOSIS — J45909 Unspecified asthma, uncomplicated: Secondary | ICD-10-CM | POA: Insufficient documentation

## 2017-07-12 DIAGNOSIS — K5730 Diverticulosis of large intestine without perforation or abscess without bleeding: Secondary | ICD-10-CM | POA: Insufficient documentation

## 2017-07-12 DIAGNOSIS — Z1211 Encounter for screening for malignant neoplasm of colon: Secondary | ICD-10-CM | POA: Insufficient documentation

## 2017-07-12 DIAGNOSIS — K635 Polyp of colon: Secondary | ICD-10-CM | POA: Diagnosis not present

## 2017-07-12 DIAGNOSIS — Z79899 Other long term (current) drug therapy: Secondary | ICD-10-CM | POA: Insufficient documentation

## 2017-07-12 DIAGNOSIS — Z7982 Long term (current) use of aspirin: Secondary | ICD-10-CM | POA: Insufficient documentation

## 2017-07-12 DIAGNOSIS — Z7951 Long term (current) use of inhaled steroids: Secondary | ICD-10-CM | POA: Insufficient documentation

## 2017-07-12 DIAGNOSIS — E785 Hyperlipidemia, unspecified: Secondary | ICD-10-CM | POA: Insufficient documentation

## 2017-07-12 HISTORY — PX: COLONOSCOPY: SHX5424

## 2017-07-12 HISTORY — PX: POLYPECTOMY: SHX5525

## 2017-07-12 SURGERY — COLONOSCOPY
Anesthesia: Moderate Sedation

## 2017-07-12 MED ORDER — MEPERIDINE HCL 100 MG/ML IJ SOLN
INTRAMUSCULAR | Status: DC | PRN
  Administered 2017-07-12 (×2): 50 mg via INTRAVENOUS

## 2017-07-12 MED ORDER — ONDANSETRON HCL 4 MG/2ML IJ SOLN
INTRAMUSCULAR | Status: DC | PRN
  Administered 2017-07-12: 4 mg via INTRAVENOUS

## 2017-07-12 MED ORDER — SODIUM CHLORIDE 0.9 % IV SOLN
INTRAVENOUS | Status: DC
  Administered 2017-07-12: 08:00:00 via INTRAVENOUS

## 2017-07-12 MED ORDER — MIDAZOLAM HCL 5 MG/5ML IJ SOLN
INTRAMUSCULAR | Status: AC
  Filled 2017-07-12: qty 10

## 2017-07-12 MED ORDER — MEPERIDINE HCL 100 MG/ML IJ SOLN
INTRAMUSCULAR | Status: AC
  Filled 2017-07-12: qty 2

## 2017-07-12 MED ORDER — ONDANSETRON HCL 4 MG/2ML IJ SOLN
INTRAMUSCULAR | Status: AC
  Filled 2017-07-12: qty 2

## 2017-07-12 MED ORDER — MIDAZOLAM HCL 5 MG/5ML IJ SOLN
INTRAMUSCULAR | Status: DC | PRN
Start: 2017-07-12 — End: 2017-07-12
  Administered 2017-07-12 (×2): 2 mg via INTRAVENOUS

## 2017-07-12 NOTE — Discharge Instructions (Signed)
Colonoscopy Discharge Instructions  Read the instructions outlined below and refer to this sheet in the next few weeks. These discharge instructions provide you with general information on caring for yourself after you leave the hospital. Your doctor may also give you specific instructions. While your treatment has been planned according to the most current medical practices available, unavoidable complications occasionally occur. If you have any problems or questions after discharge, call Dr. Jena Gaussourk at [phone]. ACTIVITY  You may resume your regular activity, but move at a slower pace for the next 24 hours.   Take frequent rest periods for the next 24 hours.   Walking will help get rid of the air and reduce the bloated feeling in your belly (abdomen).   No driving for 24 hours (because of the medicine (anesthesia) used during the test).    Do not sign any important legal documents or operate any machinery for 24 hours (because of the anesthesia used during the test).  NUTRITION  Drink plenty of fluids.   You may resume your normal diet as instructed by your doctor.   Begin with a light meal and progress to your normal diet. Heavy or fried foods are harder to digest and may make you feel sick to your stomach (nauseated).   Avoid alcoholic beverages for 24 hours or as instructed.  MEDICATIONS  You may resume your normal medications unless your doctor tells you otherwise.  WHAT YOU CAN EXPECT TODAY  Some feelings of bloating in the abdomen.   Passage of more gas than usual.   Spotting of blood in your stool or on the toilet paper.  IF YOU HAD POLYPS REMOVED DURING THE COLONOSCOPY:  No aspirin products for 7 days or as instructed.   No alcohol for 7 days or as instructed.   Eat a soft diet for the next 24 hours.  FINDING OUT THE RESULTS OF YOUR TEST Not all test results are available during your visit. If your test results are not back during the visit, make an appointment  with your caregiver to find out the results. Do not assume everything is normal if you have not heard from your caregiver or the medical facility. It is important for you to follow up on all of your test results.  SEEK IMMEDIATE MEDICAL ATTENTION IF:  You have more than a spotting of blood in your stool.   Your belly is swollen (abdominal distention).   You are nauseated or vomiting.   You have a temperature over 101.   You have abdominal pain or discomfort that is severe or gets worse throughout the day.    Diverticulosis and colon polyp information provided  Further recommendations to follow pending review of pathology report  Loud snoring noted during procedure. Consider getting a sleep study   Diverticulosis Diverticulosis is a condition that develops when small pouches (diverticula) form in the wall of the large intestine (colon). The colon is where water is absorbed and stool is formed. The pouches form when the inside layer of the colon pushes through weak spots in the outer layers of the colon. You may have a few pouches or many of them. What are the causes? The cause of this condition is not known. What increases the risk? The following factors may make you more likely to develop this condition:  Being older than age 64. Your risk for this condition increases with age. Diverticulosis is rare among people younger than age 64. By age 680, many people have it.  Eating a low-fiber diet.  Having frequent constipation.  Being overweight.  Not getting enough exercise.  Smoking.  Taking over-the-counter pain medicines, like aspirin and ibuprofen.  Having a family history of diverticulosis.  What are the signs or symptoms? In most people, there are no symptoms of this condition. If you do have symptoms, they may include:  Bloating.  Cramps in the abdomen.  Constipation or diarrhea.  Pain in the lower left side of the abdomen.  How is this diagnosed? This  condition is most often diagnosed during an exam for other colon problems. Because diverticulosis usually has no symptoms, it often cannot be diagnosed independently. This condition may be diagnosed by:  Using a flexible scope to examine the colon (colonoscopy).  Taking an X-ray of the colon after dye has been put into the colon (barium enema).  Doing a CT scan.  How is this treated? You may not need treatment for this condition if you have never developed an infection related to diverticulosis. If you have had an infection before, treatment may include:  Eating a high-fiber diet. This may include eating more fruits, vegetables, and grains.  Taking a fiber supplement.  Taking a live bacteria supplement (probiotic).  Taking medicine to relax your colon.  Taking antibiotic medicines.  Follow these instructions at home:  Drink 6-8 glasses of water or more each day to prevent constipation.  Try not to strain when you have a bowel movement.  If you have had an infection before: ? Eat more fiber as directed by your health care provider or your diet and nutrition specialist (dietitian). ? Take a fiber supplement or probiotic, if your health care provider approves.  Take over-the-counter and prescription medicines only as told by your health care provider.  If you were prescribed an antibiotic, take it as told by your health care provider. Do not stop taking the antibiotic even if you start to feel better.  Keep all follow-up visits as told by your health care provider. This is important. Contact a health care provider if:  You have pain in your abdomen.  You have bloating.  You have cramps.  You have not had a bowel movement in 3 days. Get help right away if:  Your pain gets worse.  Your bloating becomes very bad.  You have a fever or chills, and your symptoms suddenly get worse.  You vomit.  You have bowel movements that are bloody or black.  You have bleeding from  your rectum. Summary  Diverticulosis is a condition that develops when small pouches (diverticula) form in the wall of the large intestine (colon).  You may have a few pouches or many of them.  This condition is most often diagnosed during an exam for other colon problems.  If you have had an infection related to diverticulosis, treatment may include increasing the fiber in your diet, taking supplements, or taking medicines. This information is not intended to replace advice given to you by your health care provider. Make sure you discuss any questions you have with your health care provider.    Colon Polyps Polyps are tissue growths inside the body. Polyps can grow in many places, including the large intestine (colon). A polyp may be a round bump or a mushroom-shaped growth. You could have one polyp or several. Most colon polyps are noncancerous (benign). However, some colon polyps can become cancerous over time. What are the causes? The exact cause of colon polyps is not known. What increases the risk?  This condition is more likely to develop in people who:  Have a family history of colon cancer or colon polyps.  Are older than 89 or older than 45 if they are African American.  Have inflammatory bowel disease, such as ulcerative colitis or Crohn disease.  Are overweight.  Smoke cigarettes.  Do not get enough exercise.  Drink too much alcohol.  Eat a diet that is: ? High in fat and red meat. ? Low in fiber.  Had childhood cancer that was treated with abdominal radiation.  What are the signs or symptoms? Most polyps do not cause symptoms. If you have symptoms, they may include:  Blood coming from your rectum when having a bowel movement.  Blood in your stool.The stool may look dark red or black.  A change in bowel habits, such as constipation or diarrhea.  How is this diagnosed? This condition is diagnosed with a colonoscopy. This is a procedure that uses a  lighted, flexible scope to look at the inside of your colon. How is this treated? Treatment for this condition involves removing any polyps that are found. Those polyps will then be tested for cancer. If cancer is found, your health care provider will talk to you about options for colon cancer treatment. Follow these instructions at home: Diet  Eat plenty of fiber, such as fruits, vegetables, and whole grains.  Eat foods that are high in calcium and vitamin D, such as milk, cheese, yogurt, eggs, liver, fish, and broccoli.  Limit foods high in fat, red meats, and processed meats, such as hot dogs, sausage, bacon, and lunch meats.  Maintain a healthy weight, or lose weight if recommended by your health care provider. General instructions  Do not smoke cigarettes.  Do not drink alcohol excessively.  Keep all follow-up visits as told by your health care provider. This is important. This includes keeping regularly scheduled colonoscopies. Talk to your health care provider about when you need a colonoscopy.  Exercise every day or as told by your health care provider. Contact a health care provider if:  You have new or worsening bleeding during a bowel movement.  You have new or increased blood in your stool.  You have a change in bowel habits.  You unexpectedly lose weight. This information is not intended to replace advice given to you by your health care provider. Make sure you discuss any questions you have with your health care provider.

## 2017-07-12 NOTE — Op Note (Signed)
[facility] Patient Name: Kevin [name] Procedure Date: 07/12/2017 8:18 AM MRN: [id number] Date of Birth: 09/02/1955 Attending MD: Gennette Pacobert Michael Valla Pacey , MD CSN: [id number] Age: 62 Admit Type: Outpatient Procedure:                Colonoscopy Indications:              Screening for colorectal malignant neoplasm Providers:                Gennette Pacobert Michael Jazzy Parmer, MD, Nena PolioLisa Moore, RN, Edythe ClarityKelly                            Cox, Technician Referring MD:              Medicines:                Midazolam 4 mg IV, Meperidine 100 mg IV,                            Ondansetron 4 mg IV Complications:            No immediate complications. Estimated Blood Loss:     Estimated blood loss was minimal. Estimated blood                            loss was minimal. Procedure:                Pre-Anesthesia Assessment:                           - Prior to the procedure, a History and Physical                            was performed, and patient medications and                            allergies were reviewed. The patient's tolerance of                            previous anesthesia was also reviewed. The risks                            and benefits of the procedure and the sedation                            options and risks were discussed with the patient.                            All questions were answered, and informed consent                            was obtained. Prior Anticoagulants: The patient has                            taken no previous anticoagulant or antiplatelet  agents. ASA Grade Assessment: II - A patient with                            mild systemic disease. After reviewing the risks                            and benefits, the patient was deemed in                            satisfactory condition to undergo the procedure.                           After obtaining informed consent, the colonoscope                            was passed under direct vision.  Throughout the                            procedure, the patient's blood pressure, pulse, and                            oxygen saturations were monitored continuously. The                            EC-[id number] ([id number]) scope was introduced through                            the anus and advanced to the the cecum, identified                            by appendiceal orifice and ileocecal valve. The                            colonoscopy was performed without difficulty. The                            patient tolerated the procedure well. The quality                            of the bowel preparation was adequate. The quality                            of the bowel preparation was adequate. The entire                            colon was well visualized. The ileocecal valve,                            appendiceal orifice, and rectum were photographed. Scope In: 8:42:03 AM Scope Out: 8:52:00 AM Scope Withdrawal Time: 0 hours 7 minutes 55 seconds  Total Procedure Duration: 0 hours 9 minutes 57 seconds  Findings:      The perianal and digital rectal examinations were normal.      Scattered small  and large-mouthed diverticula were found in the sigmoid       colon and descending colon.      A 5 mm polyp was found in the recto-sigmoid colon. The polyp was       semi-pedunculated. The polyp was removed with a cold snare. Resection       and retrieval were complete. Estimated blood loss was minimal.      Non-bleeding internal hemorrhoids were found during retroflexion. The       hemorrhoids were moderate, medium-sized and Grade I (internal       hemorrhoids that do not prolapse).      The exam was otherwise without abnormality on direct and retroflexion       views. Impression:               - Diverticulosis in the sigmoid colon and in the                            descending colon.                           - One 5 mm polyp at the recto-sigmoid colon,                            removed with  a cold snare. Resected and retrieved.                            Loud snoring noted during procedure. Moderate Sedation:      Moderate (conscious) sedation was administered by the endoscopy nurse       and supervised by the endoscopist. The following parameters were       monitored: oxygen saturation, heart rate, blood pressure, respiratory       rate, EKG, adequacy of pulmonary ventilation, and response to care.       Total physician intraservice time was 24 minutes. Recommendation:           - Patient has a contact number available for                            emergencies. The signs and symptoms of potential                            delayed complications were discussed with the                            patient. Return to normal activities tomorrow.                            Written discharge instructions were provided to the                            patient.                           - Resume previous diet.                           - Continue present medications.                           -  Repeat colonoscopy date to be determined after                            pending pathology results are reviewed for                            surveillance based on pathology results.                           - Return to GI clinic (date not yet determined).                            Consider referral for sleep study. Procedure Code(s):        --- Professional ---                           [phone], Colonoscopy, flexible; with removal of                            tumor(s), polyp(s), or other lesion(s) by snare                            technique                           99152, Moderate sedation services provided by the                            same physician or other qualified health care                            professional performing the diagnostic or                            therapeutic service that the sedation supports,                            requiring the presence of an  independent trained                            observer to assist in the monitoring of the                            patient's level of consciousness and physiological                            status; initial 15 minutes of intraservice time,                            patient age 58 years or older                           [phone], Moderate sedation services; each additional  15 minutes intraservice time Diagnosis Code(s):        --- Professional ---                           Z12.11, Encounter for screening for malignant                            neoplasm of colon                           D12.7, Benign neoplasm of rectosigmoid junction                           K57.30, Diverticulosis of large intestine without                            perforation or abscess without bleeding CPT copyright 2016 [employer]. All rights reserved. The codes documented in this report are preliminary and upon coder review may  be revised to meet current compliance requirements. Gerrit Friends. Kris Burd, MD Gennette Pac, MD 07/12/2017 9:00:22 AM This report has been signed electronically. Number of Addenda: 0

## 2017-07-12 NOTE — H&P (Signed)
@LOGO @   Primary Care Physician:  Gareth Morgan, MD Primary Gastroenterologist:  Dr. Jena Gauss  Pre-Procedure History & Physical: HPI:  Kevin [name] is a 62 y.o. male is here for a screening colonoscopy. Negative colonoscopy 2007. No family history of colon cancer. No GI symptoms.  Past Medical History:  Diagnosis Date  . ALLERGIC RHINITIS   . Asthma   . Dyslipidemia   . Family history of heart disease   . Hepatitis    3rd Grade  . History of insomnia   . History of nuclear stress test 08/2007    negative bruce myoview  . History of palpitations     Past Surgical History:  Procedure Laterality Date  . NASAL SEPTOPLASTY W/ TURBINOPLASTY  2000   deviated septum  . Sleep Study  07/11/2005   AHI during total sleep 3.68/hr, during REM sleep 5.52/hr  . TONSILLECTOMY    . TRANSTHORACIC ECHOCARDIOGRAM  09/13/2007   EF=>55%, borderline asymmetric LVH; mild MR/TR, normal RVSP; AV mildly sclerotic, trace AV regurg & trave pulm vavle regurg  . varicocoel      Prior to Admission medications   Medication Sig Start Date End Date Taking? Authorizing Provider  albuterol (PROAIR HFA) 108 (90 Base) MCG/ACT inhaler USE 2 INHALATIONS ORALLY 4 TIMES DAILY AS NEEDED Patient taking differently: Inhale 2 puffs into the lungs every 4 (four) hours as needed for wheezing or shortness of breath. USE 2 INHALATIONS ORALLY 4 TIMES DAILY AS NEEDED 11/18/15  Yes Kozlow, Alvira Philips, MD  aspirin 81 MG tablet Take 81 mg by mouth daily.   Yes [provider]  budesonide (RHINOCORT ALLERGY) 32 MCG/ACT nasal spray Place 1 spray into both nostrils daily as needed for allergies.    Yes [provider]  budesonide-formoterol (SYMBICORT) 160-4.5 MCG/ACT inhaler Inhale 2 puffs into the lungs 2 (two) times daily. 06/05/17  Yes Kozlow, Alvira Philips, MD  cromolyn (NASALCROM) 5.2 MG/ACT nasal spray Place 1 spray into both nostrils daily as needed for allergies.    Yes [provider]  fexofenadine (ALLEGRA) 180  MG tablet Take 180 mg by mouth daily as needed for allergies.    Yes [provider]  Glucosamine-Chondroitin (OSTEO BI-FLEX REGULAR STRENGTH PO) Take 2 tablets by mouth daily.    Yes [provider]  Methylcellulose, Laxative, 500 MG TABS Take 2 tablets by mouth daily.   Yes [provider]  omeprazole (PRILOSEC) 20 MG capsule Take 1 capsule (20 mg total) by mouth daily. 02/14/17  Yes Kozlow, Alvira Philips, MD  polycarbophil (FIBERCON) 625 MG tablet Take 1,250 mg by mouth daily.    Yes [provider]  ranitidine (ZANTAC) 300 MG tablet Take 1 tablet (300 mg total) by mouth at bedtime. 02/14/17  Yes Kozlow, Alvira Philips, MD  SAW PALMETTO, SERENOA REPENS, PO Take 2 capsules by mouth daily.   Yes [provider]  simvastatin (ZOCOR) 20 MG tablet Take 1 tablet (20 mg total) by mouth at bedtime. 01/25/13  Yes Hilty, Lisette Abu, MD  montelukast (SINGULAIR) 10 MG tablet Take 1 tablet (10 mg total) by mouth at bedtime. 02/14/17   Kozlow, Alvira Philips, MD    Allergies as of 05/22/2017  . (No Known Allergies)    Family History  Problem Relation Age of Onset  . Heart attack Father 55       also CHF & lung problems  . Heart attack Brother 55  . Heart attack Brother 55  . Heart attack Maternal Grandmother   .  Thyroid cancer Maternal Grandfather   . Allergic rhinitis Neg Hx   . Angioedema Neg Hx   . Asthma Neg Hx   . Eczema Neg Hx   . Immunodeficiency Neg Hx   . Colon cancer Neg Hx     Social History   Socioeconomic History  . Marital status: Married    Spouse name: Not on file  . Number of children: 2  . Years of education: Not on file  . Highest education level: Not on file  Social Needs  . Financial resource strain: Not on file  . Food insecurity - worry: Not on file  . Food insecurity - inability: Not on file  . Transportation needs - medical: Not on file  . Transportation needs - non-medical: Not on file  Occupational History  . 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 Smoking status: Never Smoker  . Smokeless tobacco: Never Used  Substance and Sexual Activity  . Alcohol use: No  . Drug use: No  . Sexual activity: Not on file  Other Topics Concern  . Not on file  Social History Narrative  . Not on file    Review of Systems: See HPI, otherwise negative ROS  Physical Exam: BP (!) 145/88   Pulse (!) 110   Temp 98 F (36.7 C) (Oral)   Resp 15   Ht 5\' 9"  (1.753 m)   Wt 165 lb (74.8 kg)   SpO2 100%   BMI 24.37 kg/m  General:   Alert,  Well-developed, well-nourished, pleasant and cooperative in NAD Lungs:  Clear throughout to auscultation.   No wheezes, crackles, or rhonchi. No acute distress. Heart:  Regular rate and rhythm; no murmurs, clicks, rubs,  or gallops. Abdomen:  Soft, nontender and nondistended. No masses, hepatosplenomegaly or hernias noted. Normal bowel sounds, without guarding, and without rebound.    Impression/Plan: Kevin [name] is now here to undergo a screening colonoscopy.  Average risk screening examination.  Risks, benefits, limitations, imponderables and alternatives regarding colonoscopy have been reviewed with the patient. Questions have been answered. All parties agreeable.     Notice:  This dictation was prepared with Dragon dictation along with smaller phrase technology. Any transcriptional errors that result from this process are unintentional and may not be corrected upon review.

## 2017-07-13 ENCOUNTER — Encounter: Payer: Self-pay | Admitting: Internal Medicine

## 2017-07-17 ENCOUNTER — Encounter (HOSPITAL_COMMUNITY): Payer: Self-pay | Admitting: Internal Medicine

## 2017-08-15 ENCOUNTER — Encounter: Payer: Self-pay | Admitting: Allergy and Immunology

## 2017-08-15 ENCOUNTER — Other Ambulatory Visit: Payer: Self-pay

## 2017-08-15 ENCOUNTER — Ambulatory Visit: Payer: Federal, State, Local not specified - PPO | Admitting: Allergy and Immunology

## 2017-08-15 VITALS — BP 120/74 | HR 92 | Resp 24

## 2017-08-15 DIAGNOSIS — J4541 Moderate persistent asthma with (acute) exacerbation: Secondary | ICD-10-CM

## 2017-08-15 DIAGNOSIS — K219 Gastro-esophageal reflux disease without esophagitis: Secondary | ICD-10-CM | POA: Diagnosis not present

## 2017-08-15 DIAGNOSIS — J3089 Other allergic rhinitis: Secondary | ICD-10-CM | POA: Diagnosis not present

## 2017-08-15 MED ORDER — MONTELUKAST SODIUM 10 MG PO TABS: 10.0000 mg | ORAL_TABLET | Freq: Every day | ORAL | 1 refills | Status: DC

## 2017-08-15 MED ORDER — METHYLPREDNISOLONE ACETATE 80 MG/ML IJ SUSP
80.0000 mg | Freq: Once | INTRAMUSCULAR | Status: AC
  Administered 2017-08-15: 80 mg via INTRAMUSCULAR

## 2017-08-15 MED ORDER — RANITIDINE HCL 300 MG PO TABS: 300.0000 mg | ORAL_TABLET | Freq: Every day | ORAL | 1 refills | Status: DC

## 2017-08-15 MED ORDER — OMEPRAZOLE 20 MG PO CPDR: 20.0000 mg | DELAYED_RELEASE_CAPSULE | Freq: Every day | ORAL | 1 refills | Status: DC

## 2017-08-15 NOTE — Patient Instructions (Addendum)
  1. Continue to perform Allergen avoidance measures  2. Continue to Treat and prevent inflammation:   A. Symbicort 160 - 2 inhalations 1-2 times per day with spacer.  B. OTC Rhinocort one spray each nostril once 3-7 times per week  C. montelukast 10 mg one tablet once a day  D. Depo-Medrol 80 IM delivered in clinic today  3. Continue to Treat and prevent reflux:   A. minimize caffeine consumption.   B. OTC omeprazole 20 mg 1 tablet in a.m.  C. OTC ranitidine 150 mg 2 tablets in PM  4. If needed:   A. ProAir HFA 2 puffs every 4-6 hours  B. nasal saline spray  C. OTC antihistamine  5. Return to clinic in 6 months or earlier if problem

## 2017-08-15 NOTE — Progress Notes (Signed)
Follow-up Note  Referring Provider: Gareth MorganKnowlton, Steve, MD Primary Provider: Gareth MorganKnowlton, Steve, MD Date of Office Visit: 08/15/2017  Subjective:   Kevin [name] (DOB: 02/17/1956) is a 62 y.o. male [facility] in re-evaluation of the following:  HPI: Kevin [name] presents to this clinic in reevaluation of asthma and allergic rhinitis and LPR.  His last visit to this clinic was 14 February 2017.  He did very well over the course of the past 6 months but had a head cold in January that gave rise to persistent congestion of his head and some coughing for several weeks but he resolved that issue only to develop a cough over the course of the past 2 weeks.  His chest feels congested.  He has not been having any chest pain or sputum production.  He has not been having any throat issues or nasal issues.  He has been consistently using all of his anti-inflammatory medications for both his upper and lower airway and has been aggressively treating reflux.  His requirement for short acting bronchodilator is less than 1 time per week.  For the most part his reflux is under very good control as long as he continues to use to aggressive treatment directed against this problem.  Allergies as of 08/15/2017   No Known Allergies     Medication List      albuterol 108 (90 Base) MCG/ACT inhaler Commonly known as:  PROAIR HFA USE 2 INHALATIONS ORALLY 4 TIMES DAILY AS NEEDED   aspirin 81 MG tablet Take 81 mg by mouth daily.   budesonide-formoterol 160-4.5 MCG/ACT inhaler Commonly known as:  SYMBICORT Inhale 2 puffs into the lungs 2 (two) times daily.   fexofenadine 180 MG tablet Commonly known as:  ALLEGRA Take 180 mg by mouth daily as needed for allergies.   Methylcellulose (Laxative) 500 MG Tabs Take 2 tablets by mouth daily.   montelukast 10 MG tablet Commonly known as:  SINGULAIR Take 1 tablet (10 mg total) by mouth at bedtime.   NASALCROM 5.2 MG/ACT  nasal spray Generic drug:  cromolyn Place 1 spray into both nostrils daily as needed for allergies.   omeprazole 20 MG capsule Commonly known as:  PRILOSEC Take 1 capsule (20 mg total) by mouth daily.   OSTEO BI-FLEX REGULAR STRENGTH PO Take 2 tablets by mouth daily.   polycarbophil 625 MG tablet Commonly known as:  FIBERCON Take 1,250 mg by mouth daily.   ranitidine 300 MG tablet Commonly known as:  ZANTAC Take 1 tablet (300 mg total) by mouth at bedtime.   RHINOCORT ALLERGY 32 MCG/ACT nasal spray Generic drug:  budesonide Place 1 spray into both nostrils daily as needed for allergies.   SAW PALMETTO (SERENOA REPENS) PO Take 2 capsules by mouth daily.   simvastatin 20 MG tablet Commonly known as:  ZOCOR Take 1 tablet (20 mg total) by mouth at bedtime.       Past Medical History:  Diagnosis Date  . ALLERGIC RHINITIS   . Asthma   . Dyslipidemia   . Family history of heart disease   . Hepatitis    3rd Grade  . History of insomnia   . History of nuclear stress test 08/2007    negative bruce myoview  . History of palpitations     Past Surgical History:  Procedure Laterality Date  . COLONOSCOPY N/A 07/12/2017   Procedure: COLONOSCOPY;  Surgeon: Kevin [name], Kevin M, MD;  [facility];  Service: Endoscopy;  Laterality: N/A;  . NASAL SEPTOPLASTY W/ TURBINOPLASTY  2000   deviated septum  . POLYPECTOMY  07/12/2017   Procedure: POLYPECTOMY;  Surgeon: Kevin Ade, MD;  Location: [facility];  Service: Endoscopy;;  recto-sigmoid colon  . Sleep Study  07/11/2005   AHI during total sleep 3.68/hr, during REM sleep 5.52/hr  . TONSILLECTOMY    . TRANSTHORACIC ECHOCARDIOGRAM  09/13/2007   EF=>55%, borderline asymmetric LVH; mild MR/TR, normal RVSP; AV mildly sclerotic, trace AV regurg & trave pulm vavle regurg  . varicocoel      Review of systems negative except as noted in HPI / PMHx or noted below:  Review of Systems  Constitutional: Negative.   HENT:  Negative.   Eyes: Negative.   Respiratory: Negative.   Cardiovascular: Negative.   Gastrointestinal: Negative.   Genitourinary: Negative.   Musculoskeletal: Negative.   Skin: Negative.   Neurological: Negative.   Endo/Heme/Allergies: Negative.   Psychiatric/Behavioral: Negative.      Objective:   Vitals:   08/15/17 1510  BP: 120/74  Pulse: 92  Resp: (!) 24          Physical Exam  HENT:  Head: Normocephalic.  Right Ear: Tympanic membrane, external ear and ear canal normal.  Left Ear: Tympanic membrane, external ear and ear canal normal.  Nose: Nose normal. No mucosal edema or rhinorrhea.  Mouth/Throat: Uvula is midline, oropharynx is clear and moist and mucous membranes are normal. No oropharyngeal exudate.  Eyes: Conjunctivae are normal.  Neck: Trachea normal. No tracheal tenderness present. No tracheal deviation present. No thyromegaly present.  Cardiovascular: Normal rate, regular rhythm, S1 normal, S2 normal and normal heart sounds.  No murmur heard. Pulmonary/Chest: Breath sounds normal. No stridor. No respiratory distress. He has no wheezes. He has no rales.  Musculoskeletal: He exhibits no edema.  Lymphadenopathy:       Head (right side): No tonsillar adenopathy present.       Head (left side): No tonsillar adenopathy present.    He has no cervical adenopathy.  Neurological: He is alert.  Skin: No rash noted. He is not diaphoretic. No erythema. Nails show no clubbing.    Diagnostics:    Spirometry was performed and demonstrated an FEV1 of 4.08 at 122 % of predicted.  The patient had an Asthma Control Test with the following results: ACT Total Score: 21.    Assessment and Plan:   1. Asthma, not well controlled, moderate persistent, with acute exacerbation   2. Other allergic rhinitis   3. LPRD (laryngopharyngeal reflux disease)     1. Continue to perform Allergen avoidance measures  2. Continue to Treat and prevent inflammation:   A. Symbicort 160 -  2 inhalations 1-2 times per day with spacer.  B. OTC Rhinocort one spray each nostril once 3-7 times per week  C. montelukast 10 mg one tablet once a day  D. Depo-Medrol 80 IM delivered in clinic today  3. Continue to Treat and prevent reflux:   A. minimize caffeine consumption.   B. OTC omeprazole 20 mg 1 tablet in a.[name].  C. OTC ranitidine 150 mg 2 tablets in PM  4. If needed:   A. ProAir HFA 2 puffs every 4-6 hours  B. nasal saline spray  C. OTC antihistamine  5. Return to clinic in 6 months or earlier if problem   Yuvaan appears to have had some problems with respiratory tract irritation and inflammation since he has arrived in the spring time  season and I will treat him with a systemic steroid today as noted above while he continues on a very large collection of medical therapy directed against both upper and lower respiratory tract inflammation and also continues on therapy directed against reflux.  If he does well I will see him back in this clinic in 6 months or earlier if there is a problem.  In the past we have had a prolonged discussion about the role of immunotherapy in treating his spring and fall flareups regarding his atopic disease but at this point he is not very interested in undergoing this form of treatment.  Kevin Schimke, MD Allergy / Immunology [facility]

## 2017-08-16 ENCOUNTER — Encounter: Payer: Self-pay | Admitting: Allergy and Immunology

## 2017-09-20 ENCOUNTER — Ambulatory Visit: Payer: Federal, State, Local not specified - PPO | Admitting: Allergy and Immunology

## 2017-09-20 ENCOUNTER — Telehealth: Payer: Self-pay | Admitting: *Deleted

## 2017-09-20 ENCOUNTER — Encounter: Payer: Self-pay | Admitting: Allergy and Immunology

## 2017-09-20 VITALS — BP 122/86 | HR 88 | Resp 20

## 2017-09-20 DIAGNOSIS — J3089 Other allergic rhinitis: Secondary | ICD-10-CM | POA: Diagnosis not present

## 2017-09-20 DIAGNOSIS — J4541 Moderate persistent asthma with (acute) exacerbation: Secondary | ICD-10-CM

## 2017-09-20 DIAGNOSIS — G4733 Obstructive sleep apnea (adult) (pediatric): Secondary | ICD-10-CM

## 2017-09-20 NOTE — Progress Notes (Signed)
Kevin [name] returns to this clinic to have skin testing performed in anticipation of starting a course of immunotherapy.  As well, Kevin [name] informs me that he has severe snoring and his wife tells him that he has sleep apnea and indeed he does wake up at nighttime with an extremely dry mouth and he had a sleep study in 2007 which apparently was inconclusive.  Allergy skin testing was performed.  He demonstrated hypersensitivity to pollens including grasses and weeds and also demonstrated hypersensitivity to house dust mite.Marland Kitchen  He will perform allergy avoidance measures as best as possible and start a course of immunotherapy.  As well, we will arrange for him to have a sleep study using a split night protocol.

## 2017-09-20 NOTE — Telephone Encounter (Signed)
Kevin [name] can you obtain a split sleep study per Dr Lucie Leather. Order has been placed in epic

## 2017-09-21 ENCOUNTER — Encounter: Payer: Self-pay | Admitting: Allergy and Immunology

## 2017-09-21 ENCOUNTER — Other Ambulatory Visit: Payer: Self-pay

## 2017-09-21 MED ORDER — BUDESONIDE-FORMOTEROL FUMARATE 160-4.5 MCG/ACT IN AERO
2.0000 | INHALATION_SPRAY | Freq: Two times a day (BID) | RESPIRATORY_TRACT | 1 refills | Status: DC
Start: 2017-09-21 — End: 2018-02-28

## 2017-09-21 NOTE — Telephone Encounter (Signed)
Request from pharmacy for 90 day supply of Symbicort 160. I am sending this in with 1 additional refill.

## 2017-09-22 NOTE — Telephone Encounter (Signed)
Order is in the system will wait for there office to make the appointment

## 2017-09-22 NOTE — Addendum Note (Signed)
Addended by: Dub MikesHICKS, Fran Neiswonger N on: 09/22/2017 04:08 PM   Modules accepted: Orders

## 2017-09-25 NOTE — Telephone Encounter (Signed)
Noted  

## 2017-09-28 ENCOUNTER — Other Ambulatory Visit: Payer: Self-pay | Admitting: Allergy and Immunology

## 2017-09-28 DIAGNOSIS — J3089 Other allergic rhinitis: Secondary | ICD-10-CM | POA: Diagnosis not present

## 2017-09-28 DIAGNOSIS — G4733 Obstructive sleep apnea (adult) (pediatric): Secondary | ICD-10-CM

## 2017-09-28 NOTE — Progress Notes (Signed)
VIALS EXP 09-29-18  

## 2017-09-29 DIAGNOSIS — J301 Allergic rhinitis due to pollen: Secondary | ICD-10-CM | POA: Diagnosis not present

## 2017-10-02 NOTE — Telephone Encounter (Signed)
Their [facility] 09[facility]

## 2017-10-03 ENCOUNTER — Ambulatory Visit (INDEPENDENT_AMBULATORY_CARE_PROVIDER_SITE_OTHER): Payer: Federal, State, Local not specified - PPO

## 2017-10-03 ENCOUNTER — Ambulatory Visit: Payer: Federal, State, Local not specified - PPO

## 2017-10-03 DIAGNOSIS — J309 Allergic rhinitis, unspecified: Secondary | ICD-10-CM

## 2017-10-03 MED ORDER — EPINEPHRINE 0.3 MG/0.3ML IJ SOAJ: 0.3000 mg | Freq: Once | INTRAMUSCULAR | 2 refills | Status: AC

## 2017-10-03 NOTE — Addendum Note (Signed)
Addended by: Dub MikesHICKS, Toluwanimi Radebaugh N on: 10/03/2017 12:11 PM   Modules accepted: Orders

## 2017-10-10 ENCOUNTER — Ambulatory Visit (INDEPENDENT_AMBULATORY_CARE_PROVIDER_SITE_OTHER): Payer: Federal, State, Local not specified - PPO | Admitting: *Deleted

## 2017-10-10 DIAGNOSIS — J309 Allergic rhinitis, unspecified: Secondary | ICD-10-CM | POA: Diagnosis not present

## 2017-10-17 NOTE — Telephone Encounter (Signed)
Scheduling office left a voicemail for the patient to schedule on 10/17/2017

## 2017-10-20 NOTE — Telephone Encounter (Signed)
Patient is scheduled   

## 2017-10-22 NOTE — Progress Notes (Signed)
Cardiology Office Note   Date:  10/23/2017   ID:  Kevin [name], DOB 04/24/1956, MRN [id number]  PCP:  Gareth MorganKnowlton, Steve, MD  Cardiologist:  Dr. Rennis GoldenHilty Chief Complaint  Patient presents with  . Follow-up     History of Present Illness: Kevin [name] is a 62 y.o. male who presents for ongoing assessment and management of palpitations, with history of dyslipidemia, asthma, and family history of coronary artery disease.  The patient is normally followed annually by Dr. Rennis GoldenHilty.  He was last seen on 03/23/2016 at which time he was clinically stable from a cardiac standpoint.  No medication changes were made, his cholesterol/LDL was improved, and no further cardiac testing was planned.  He comes today without any cardiac complaints, he said no chest pain, he remains active and is not complaining of any fatigue other than when he works outside in extreme heat, he goes inside to rest, he denies any worsening of his breathing status, palpitations.  He has not been hospitalized, does not have any new drug allergies since being seen last.  He is medically compliant.  Past Medical History:  Diagnosis Date  . ALLERGIC RHINITIS   . Asthma   . Dyslipidemia   . Family history of heart disease   . Hepatitis    3rd Grade  . History of insomnia   . History of nuclear stress test 08/2007    negative bruce myoview  . History of palpitations     Past Surgical History:  Procedure Laterality Date  . COLONOSCOPY N/A 07/12/2017   Procedure: COLONOSCOPY;  Surgeon: Corbin Adeourk, Robert M, MD;  [facility];  Service: Endoscopy;  Laterality: N/A;  . NASAL SEPTOPLASTY W/ TURBINOPLASTY  2000   deviated septum  . POLYPECTOMY  07/12/2017   Procedure: POLYPECTOMY;  Surgeon: Corbin Adeourk, Robert M, MD;  [facility];  Service: Endoscopy;;  recto-sigmoid colon  . Sleep Study  07/11/2005   AHI during total sleep 3.68/hr, during REM sleep 5.52/hr  . TONSILLECTOMY    . TRANSTHORACIC ECHOCARDIOGRAM  09/13/2007   EF=>55%, borderline asymmetric LVH; mild MR/TR, normal RVSP; AV mildly sclerotic, trace AV regurg & trave pulm vavle regurg  . varicocoel       Current Outpatient Medications  Medication Sig Dispense Refill  . albuterol (PROAIR HFA) 108 (90 Base) MCG/ACT inhaler USE 2 INHALATIONS ORALLY 4 TIMES DAILY AS NEEDED (Patient taking differently: Inhale 2 puffs into the lungs every 4 (four) hours as needed for wheezing or shortness of breath. USE 2 INHALATIONS ORALLY 4 TIMES DAILY AS NEEDED) 1 Inhaler 0  . aspirin 81 MG tablet Take 81 mg by mouth daily.    . budesonide (RHINOCORT ALLERGY) 32 MCG/ACT nasal spray Place 1 spray into both nostrils daily as needed for allergies.     . budesonide-formoterol (SYMBICORT) 160-4.5 MCG/ACT inhaler Inhale 2 puffs into the lungs 2 (two) times daily. 3 Inhaler 1  . cromolyn (NASALCROM) 5.2 MG/ACT nasal spray Place 1 spray into both nostrils daily as needed for allergies.     . fexofenadine (ALLEGRA) 180 MG tablet Take 180 mg by mouth daily as needed for allergies.     . Methylcellulose, Laxative, 500 MG TABS Take 2 tablets by mouth daily.    . montelukast (SINGULAIR) 10 MG tablet Take 1 tablet (10 mg total) by mouth at bedtime. 90 tablet 1  . omeprazole (PRILOSEC) 20 MG capsule Take 1 capsule (20 mg total) by mouth daily. 90 capsule 1  . PRESCRIPTION MEDICATION  ALLERGY VACCINE -- Use as directed weekly.    . ranitidine (ZANTAC) 300 MG tablet Take 1 tablet (300 mg total) by mouth at bedtime. 90 tablet 1  . SAW PALMETTO, SERENOA REPENS, PO Take 2 capsules by mouth daily.    . simvastatin (ZOCOR) 20 MG tablet Take 1 tablet (20 mg total) by mouth at bedtime. 30 tablet 0   No current facility-administered medications for this visit.     Allergies:   Patient has no known allergies.    Social History:  The patient  reports that he has never smoked. He has never used smokeless tobacco. He reports that he does not drink alcohol or use drugs.   Family History:  The  patient's family history includes Heart attack in his maternal grandmother; Heart attack (age of onset: 34) in his brother and brother; Heart attack (age of onset: 74) in his father; Thyroid cancer in his maternal grandfather.    ROS: All other systems are reviewed and negative. Unless otherwise mentioned in H&P    PHYSICAL EXAM: VS:  BP 126/83   Pulse (!) 102   Ht 5\' 9"  (1.753 m)   Wt 167 lb 6.4 oz (75.9 kg)   BMI 24.72 kg/m  , BMI Body mass index is 24.72 kg/m. GEN: Well nourished, well developed, in no acute distress  HEENT: normal  Neck: no JVD, carotid bruits, or masses Cardiac: RRR; tachycardic soft systolic murmurs, rubs, or gallops,no edema  Respiratory:  clear to auscultation bilaterally, normal work of breathing GI: soft, nontender, nondistended, + BS MS: no deformity or atrophy  Skin: warm and dry, no rash Neuro:  Strength and sensation are intact Psych: euthymic mood, full affect   EKG: Sinus tachycardia, heart rate of 102 bpm, nonspecific anterior abnormality.  Recent Labs: No results found for requested labs within last 8760 hours.    Lipid Panel No results found for: CHOL, TRIG, HDL, CHOLHDL, VLDL, LDLCALC, LDLDIRECT    Wt Readings from Last 3 Encounters:  10/23/17 167 lb 6.4 oz (75.9 kg)  07/12/17 165 lb (74.8 kg)  02/14/17 170 lb (77.1 kg)      Other studies Reviewed: Echo and stress test is scanned into chart from 2009.   ASSESSMENT AND PLAN:  1.  Dyslipidemia: He has labs completed in November each year through his primary care physician Dr. Sudie Bailey.  The patient has a strong family history of coronary artery disease, and is now on statin therapy with labs from 2018 reviewed on this office visit.  He will continue simvastatin and low-cholesterol diet, continue to be active.  No changes in his medication regimen at this time.  2.  Tachycardia: Noted on EKG during exam.  He states he has had some caffeine this morning which may be contributing.   Will follow.  No planned cardiac testing at this time.   Current medicines are reviewed at length with the patient today.    Labs/ tests ordered today include:none  Bettey Mare. Liborio Nixon, ANP, AACC   10/23/2017 10:34 AM     [facility] [address] [place], [place] [address] Phone: [phone]; Fax: [phone]

## 2017-10-23 ENCOUNTER — Encounter: Payer: Self-pay | Admitting: Adult Health

## 2017-10-23 ENCOUNTER — Ambulatory Visit (INDEPENDENT_AMBULATORY_CARE_PROVIDER_SITE_OTHER): Payer: Federal, State, Local not specified - PPO | Admitting: Adult Health

## 2017-10-23 VITALS — BP 126/83 | HR 102 | Ht 69.0 in | Wt 167.4 lb

## 2017-10-23 DIAGNOSIS — Z8249 Family history of ischemic heart disease and other diseases of the circulatory system: Secondary | ICD-10-CM

## 2017-10-23 DIAGNOSIS — E7800 Pure hypercholesterolemia, unspecified: Secondary | ICD-10-CM

## 2017-10-23 NOTE — Patient Instructions (Signed)
Medication Instructions:  NO CHANGES- Your physician recommends that you continue on your current medications as directed. Please refer to the Current Medication list given to you today.  If you need a refill on your cardiac medications before your next appointment, please call your pharmacy.  Follow-Up: Your physician wants you to follow-up in: 12 MONTHS WITH DR Alfonso RamusHILRY You should receive a reminder letter in the mail two months in advance. If you do not receive a letter, please call our office MAY 2020 to schedule the July 2020 follow-up appointment.   Thank you for choosing [facility]!!

## 2017-10-24 ENCOUNTER — Ambulatory Visit (INDEPENDENT_AMBULATORY_CARE_PROVIDER_SITE_OTHER): Payer: Federal, State, Local not specified - PPO | Admitting: *Deleted

## 2017-10-24 DIAGNOSIS — J309 Allergic rhinitis, unspecified: Secondary | ICD-10-CM | POA: Diagnosis not present

## 2017-10-25 ENCOUNTER — Ambulatory Visit: Payer: Federal, State, Local not specified - PPO | Attending: Allergy and Immunology | Admitting: Neurology

## 2017-10-25 DIAGNOSIS — G4733 Obstructive sleep apnea (adult) (pediatric): Secondary | ICD-10-CM | POA: Diagnosis not present

## 2017-10-25 DIAGNOSIS — Z7982 Long term (current) use of aspirin: Secondary | ICD-10-CM | POA: Insufficient documentation

## 2017-10-25 DIAGNOSIS — Z7951 Long term (current) use of inhaled steroids: Secondary | ICD-10-CM | POA: Diagnosis not present

## 2017-10-25 DIAGNOSIS — Z79899 Other long term (current) drug therapy: Secondary | ICD-10-CM | POA: Insufficient documentation

## 2017-10-29 NOTE — Procedures (Signed)
Elsberry A. Merlene Laughter, MD     [email or web address]             NOCTURNAL POLYSOMNOGRAPHY   LOCATION: [facility]    Patient Name: Kevin [name], Kevin [name] Date: 10/25/2017 Gender: Male D.O.B: April 01, 1956 Age (years): 61 Referring Provider: Allena Katz Height (inches): 83 Interpreting Physician: Phillips Odor MD, ABSM Weight (lbs): 167 RPSGT: Peak, Robert BMI: 25 MRN: [id number] Neck Size: 15.50 CLINICAL INFORMATION Sleep Study Type: Split Night CPAP     Indication for sleep study: OSA     Epworth Sleepiness Score: 5  SLEEP STUDY TECHNIQUE As per the AASM Manual for the Scoring of Sleep and Associated Events v2.3 (April 2016) with a hypopnea requiring 4% desaturations.  The channels recorded and monitored were frontal, central and occipital EEG, electrooculogram (EOG), submentalis EMG (chin), nasal and oral airflow, thoracic and abdominal wall motion, anterior tibialis EMG, snore microphone, electrocardiogram, and pulse oximetry. Continuous positive airway pressure (CPAP) was initiated when the patient met split night criteria and was titrated according to treat sleep-disordered breathing.  MEDICATIONS Medications self-administered by patient taken the night of the study : N/A  Current Outpatient Medications:  .  albuterol (PROAIR HFA) 108 (90 Base) MCG/ACT inhaler, USE 2 INHALATIONS ORALLY 4 TIMES DAILY AS NEEDED (Patient taking differently: Inhale 2 puffs into the lungs every 4 (four) hours as needed for wheezing or shortness of breath. USE 2 INHALATIONS ORALLY 4 TIMES DAILY AS NEEDED), Disp: 1 Inhaler, Rfl: 0 .  aspirin 81 MG tablet, Take 81 mg by mouth daily., Disp: , Rfl:  .  budesonide (RHINOCORT ALLERGY) 32 MCG/ACT nasal spray, Place 1 spray into both nostrils daily as needed for allergies. , Disp: , Rfl:  .  budesonide-formoterol (SYMBICORT) 160-4.5 MCG/ACT inhaler, Inhale 2 puffs into the lungs 2 (two) times daily., Disp: 3 Inhaler, Rfl: 1 .   cromolyn (NASALCROM) 5.2 MG/ACT nasal spray, Place 1 spray into both nostrils daily as needed for allergies. , Disp: , Rfl:  .  fexofenadine (ALLEGRA) 180 MG tablet, Take 180 mg by mouth daily as needed for allergies. , Disp: , Rfl:  .  Methylcellulose, Laxative, 500 MG TABS, Take 2 tablets by mouth daily., Disp: , Rfl:  .  montelukast (SINGULAIR) 10 MG tablet, Take 1 tablet (10 mg total) by mouth at bedtime., Disp: 90 tablet, Rfl: 1 .  omeprazole (PRILOSEC) 20 MG capsule, Take 1 capsule (20 mg total) by mouth daily., Disp: 90 capsule, Rfl: 1 .  PRESCRIPTION MEDICATION, ALLERGY VACCINE -- Use as directed weekly., Disp: , Rfl:  .  ranitidine (ZANTAC) 300 MG tablet, Take 1 tablet (300 mg total) by mouth at bedtime., Disp: 90 tablet, Rfl: 1 .  SAW PALMETTO, SERENOA REPENS, PO, Take 2 capsules by mouth daily., Disp: , Rfl:  .  simvastatin (ZOCOR) 20 MG tablet, Take 1 tablet (20 mg total) by mouth at bedtime., Disp: 30 tablet, Rfl: 0  RESPIRATORY PARAMETERS Diagnostic  Total AHI (/hr): 70.5 RDI (/hr): 75.1 OA Index (/hr): 54.8 CA Index (/hr): 0.8 REM AHI (/hr): N/A NREM AHI (/hr): 70.5 Supine AHI (/hr): 74.3 Non-supine AHI (/hr): 56.36 Min O2 Sat (%): 80.0 Mean O2 (%): 92.1 Time below 88% (min): 31.7   Titration  Optimal Pressure (cm): 15 AHI at Optimal Pressure (/hr): 0.0 Min O2 at Optimal Pressure (%): 94.0 Supine % at Optimal (%): 100 Sleep % at Optimal (%): 93   SLEEP ARCHITECTURE The recording time for the entire night was 413.3 minutes.  During a  baseline period of 190.0 minutes, the patient slept for 156.5 minutes in REM and nonREM, yielding a sleep efficiency of 82.4%%. Sleep onset after lights out was 13.9 minutes with a REM latency of N/A minutes. The patient spent 20.4%% of the night in stage N1 sleep, 79.6%% in stage N2 sleep, 0.0%% in stage N3 and 0.0%% in REM.     During the titration period of 218.0 minutes, the patient slept for 165.5 minutes in REM and nonREM, yielding a sleep  efficiency of 75.9%%. Sleep onset after CPAP initiation was 19.6 minutes with a REM latency of 52.0 minutes. The patient spent 9.1%% of the night in stage N1 sleep, 54.1%% in stage N2 sleep, 0.0%% in stage N3 and 36.9%% in REM.  CARDIAC DATA The 2 lead EKG demonstrated sinus rhythm. The mean heart rate was 100.0 beats per minute. Other EKG findings include: None. LEG MOVEMENT DATA The total Periodic Limb Movements of Sleep (PLMS) were 0. The PLMS index was 0.0.  IMPRESSIONS Severe obstructive sleep apnea occurred during the diagnostic portion of the study (AHI = 70.5/hour). The optimal CPAP selected for this patient is ( 15 cm of water).    Delano Metz, MD Diplomate, American Board of Sleep Medicine. ELECTRONICALLY SIGNED ON:  10/29/2017, 5:56 PM [place] PH: [phone]   FX: [phone] [place]

## 2017-10-30 NOTE — Progress Notes (Signed)
Pleaase inform patient that he has severe sleep apnea and needs a CPAP mask set at 15. Please arrange for delivery and set up of CPAP with insurance approved medical device provider.

## 2017-10-30 NOTE — Progress Notes (Signed)
Informed patient's wife of results. Faxed CPAP request to [facility] and sent [facility] a note to refer patient to Dr. Maple HudsonYoung for sleep apnea.

## 2017-10-31 ENCOUNTER — Ambulatory Visit (INDEPENDENT_AMBULATORY_CARE_PROVIDER_SITE_OTHER): Payer: Federal, State, Local not specified - PPO

## 2017-10-31 DIAGNOSIS — J309 Allergic rhinitis, unspecified: Secondary | ICD-10-CM | POA: Diagnosis not present

## 2017-11-01 ENCOUNTER — Telehealth: Payer: Self-pay

## 2017-11-01 NOTE — Telephone Encounter (Signed)
-----   Message from Mariane DuvalHeather L Vernon, [place] sent at 10/30/2017 10:29 AM EDT ----- Regarding: REFERRAL Please refer to Dr. Maple HudsonYoung for sleep apnea.

## 2017-11-01 NOTE — Telephone Encounter (Signed)
Referral placed in [facility]icient and in Epic for Dr Sinclair ShipYoungs office.

## 2017-11-07 ENCOUNTER — Ambulatory Visit (INDEPENDENT_AMBULATORY_CARE_PROVIDER_SITE_OTHER): Payer: Federal, State, Local not specified - PPO

## 2017-11-07 DIAGNOSIS — J309 Allergic rhinitis, unspecified: Secondary | ICD-10-CM

## 2017-11-21 ENCOUNTER — Ambulatory Visit (INDEPENDENT_AMBULATORY_CARE_PROVIDER_SITE_OTHER): Payer: Federal, State, Local not specified - PPO

## 2017-11-21 DIAGNOSIS — J309 Allergic rhinitis, unspecified: Secondary | ICD-10-CM

## 2017-11-23 ENCOUNTER — Encounter: Payer: Self-pay | Admitting: Pulmonary Disease

## 2017-11-23 ENCOUNTER — Ambulatory Visit (INDEPENDENT_AMBULATORY_CARE_PROVIDER_SITE_OTHER): Payer: Federal, State, Local not specified - PPO | Admitting: Pulmonary Disease

## 2017-11-23 VITALS — BP 130/92 | HR 113 | Ht 69.0 in | Wt 169.8 lb

## 2017-11-23 DIAGNOSIS — G4733 Obstructive sleep apnea (adult) (pediatric): Secondary | ICD-10-CM

## 2017-11-23 NOTE — Progress Notes (Signed)
Kevin [name]    [id number]    06/23/1955  Primary Care Physician:Knowlton, Brett Canales, MD  Referring Physician: Jessica Priest, MD [address] [place], [place] [address]  Chief complaint:   Recently diagnosed with obstructive sleep apnea, has been using CPAP for a few days CPAP mask causing blisters on his face  HPI:  Diagnosed with obstructive sleep apnea which is been using CPAP therapy Has a history of allergies  Study was performed at any pain I was titrated CPAP of 15 which he has been trying to use Download only available for 3 days So far having significant problems with mask fit-he has a large blister on his nasal bridge  Occupation: No pertinent history Exposures: Exposure Smoking history: Smoker Travel history: No recent travel Relevant family history:  Outpatient Encounter Medications as of 11/23/2017  Medication Sig  . albuterol (PROAIR HFA) 108 (90 Base) MCG/ACT inhaler USE 2 INHALATIONS ORALLY 4 TIMES DAILY AS NEEDED (Patient taking differently: Inhale 2 puffs into the lungs every 4 (four) hours as needed for wheezing or shortness of breath. USE 2 INHALATIONS ORALLY 4 TIMES DAILY AS NEEDED)  . budesonide-formoterol (SYMBICORT) 160-4.5 MCG/ACT inhaler Inhale 2 puffs into the lungs 2 (two) times daily.  . cromolyn (NASALCROM) 5.2 MG/ACT nasal spray Place 1 spray into both nostrils daily as needed for allergies.   . fexofenadine (ALLEGRA) 180 MG tablet Take 180 mg by mouth daily as needed for allergies.   . Methylcellulose, Laxative, 500 MG TABS Take 2 tablets by mouth daily.  . montelukast (SINGULAIR) 10 MG tablet Take 1 tablet (10 mg total) by mouth at bedtime.  Marland Kitchen omeprazole (PRILOSEC) 20 MG capsule Take 1 capsule (20 mg total) by mouth daily.  Marland Kitchen PRESCRIPTION MEDICATION ALLERGY VACCINE -- Use as directed weekly.  . ranitidine (ZANTAC) 300 MG tablet Take 1 tablet (300 mg total) by mouth at bedtime.  . SAW PALMETTO, SERENOA REPENS, PO Take 2 capsules  by mouth daily.  . simvastatin (ZOCOR) 20 MG tablet Take 1 tablet (20 mg total) by mouth at bedtime.  . budesonide (RHINOCORT ALLERGY) 32 MCG/ACT nasal spray Place 1 spray into both nostrils daily as needed for allergies.   . [DISCONTINUED] aspirin 81 MG tablet Take 81 mg by mouth daily.   No facility-administered encounter medications on file as of 11/23/2017.     Allergies as of 11/23/2017  . (No Known Allergies)    Past Medical History:  Diagnosis Date  . ALLERGIC RHINITIS   . Asthma   . Dyslipidemia   . Family history of heart disease   . Hepatitis    3rd Grade  . History of insomnia   . History of nuclear stress test 08/2007    negative bruce myoview  . History of palpitations     Past Surgical History:  Procedure Laterality Date  . COLONOSCOPY N/A 07/12/2017   Procedure: COLONOSCOPY;  Surgeon: Corbin Ade, MD;  Location: [facility];  Service: Endoscopy;  Laterality: N/A;  . NASAL SEPTOPLASTY W/ TURBINOPLASTY  2000   deviated septum  . POLYPECTOMY  07/12/2017   Procedure: POLYPECTOMY;  Surgeon: Corbin Ade, MD;  Location: [facility];  Service: Endoscopy;;  recto-sigmoid colon  . Sleep Study  07/11/2005   AHI during total sleep 3.68/hr, during REM sleep 5.52/hr  . TONSILLECTOMY    . TRANSTHORACIC ECHOCARDIOGRAM  09/13/2007   EF=>55%, borderline asymmetric LVH; mild MR/TR, normal RVSP; AV mildly sclerotic, trace  AV regurg & trave pulm vavle regurg  . varicocoel      Family History  Problem Relation Age of Onset  . Heart attack Father 91       also CHF & lung problems  . Heart attack Brother 55  . Heart attack Brother 55  . Heart attack Maternal Grandmother   . Thyroid cancer Maternal Grandfather   . Allergic rhinitis Neg Hx   . Angioedema Neg Hx   . Asthma Neg Hx   . Eczema Neg Hx   . Immunodeficiency Neg Hx   . Colon cancer Neg Hx     Social History   Socioeconomic History  . Marital status: Married    Spouse name: Not on file  . Number of  children: 2  . Years of education: Not on file  . Highest education level: Not on file  Occupational History  . Occupation: Visual merchandiser  Social Needs  . Financial resource strain: Not on file  . Food insecurity:    Worry: Not on file    Inability: Not on file  . Transportation needs:    Medical: Not on file    Non-medical: Not on file  Tobacco Use  . Smoking status: Never Smoker  . Smokeless tobacco: Never Used  Substance and Sexual Activity  . Alcohol use: No  . Drug use: No  . Sexual activity: Not on file  Lifestyle  . Physical activity:    Days per week: Not on file    Minutes per session: Not on file  . Stress: Not on file  Relationships  . Social connections:    Talks on phone: Not on file    Gets together: Not on file    Attends religious service: Not on file    Active member of club or organization: Not on file    Attends meetings of clubs or organizations: Not on file    Relationship status: Not on file  . Intimate partner violence:    Fear of current or ex partner: Not on file    Emotionally abused: Not on file    Physically abused: Not on file    Forced sexual activity: Not on file  Other Topics Concern  . Not on file  Social History Narrative  . Not on file    Review of Systems  Constitutional: Negative.   HENT: Positive for congestion.   Respiratory: Positive for apnea. Negative for cough.   Cardiovascular: Negative.   Endocrine: Negative.   All other systems reviewed and are negative.   Vitals:   11/23/17 1403  BP: (!) 130/92  Pulse: (!) 113  SpO2: 97%     Physical Exam  Constitutional: He is oriented to person, place, and time. He appears well-developed and well-nourished.  HENT:  Blister on his nasal bridge  Eyes: Pupils are equal, round, and reactive to light. Conjunctivae are normal. Right eye exhibits no discharge. Left eye exhibits no discharge.  Neck: Normal range of motion. Neck supple. No thyromegaly present.    Cardiovascular: Normal rate and regular rhythm.  Pulmonary/Chest: Effort normal and breath sounds normal. No respiratory distress. He has no wheezes. He has no rales.  Abdominal: Soft. Bowel sounds are normal. He exhibits no distension. There is no tenderness.  Musculoskeletal: Normal range of motion. He exhibits no edema or deformity.  Neurological: He is alert and oriented to person, place, and time.  Skin: Skin is warm and dry. No erythema.  Psychiatric: He has a normal  mood and affect.     Data Reviewed: CPAP only for 3 days Previous records reviewed  Assessment:  Obstructive sleep apnea History of rhinitis Seasonal allergies  Plan/Recommendations:  Continue inhaler use We will have him evaluated at the DME company for mask fit  Will obtain a download from his machine in about 4 weeks  Will see him back in the office in about 6 weeks  He may require different pressure, currently on pressure of 15 Occasionally has woken up feeling the pressure is too high   Virl DiamondAdewale Kimmy Totten MD  Pulmonary and Critical Care 11/23/2017, 2:46 PM  CC: Jessica PriestKozlow, Eric J, MD

## 2017-11-23 NOTE — Patient Instructions (Signed)
Obstructive sleep apnea being treated with CPAP  Inadequately fitting mask-causing a blister to form after 48 hours of use Having to pull the mask tightly to see  Will have you visit with [facility] for mask fitting emergently  Continue using CPAP at the current pressure  If pressure feels too high, we can make adjustments so we can continue treating your obstructive sleep apnea effectively  We will see you back in the office in 6 weeks  Call with concerns

## 2017-11-28 ENCOUNTER — Ambulatory Visit (INDEPENDENT_AMBULATORY_CARE_PROVIDER_SITE_OTHER): Payer: Federal, State, Local not specified - PPO | Admitting: *Deleted

## 2017-11-28 DIAGNOSIS — J309 Allergic rhinitis, unspecified: Secondary | ICD-10-CM | POA: Diagnosis not present

## 2017-12-03 ENCOUNTER — Encounter: Payer: Self-pay | Admitting: Pulmonary Disease

## 2017-12-05 ENCOUNTER — Ambulatory Visit (INDEPENDENT_AMBULATORY_CARE_PROVIDER_SITE_OTHER): Payer: Federal, State, Local not specified - PPO | Admitting: *Deleted

## 2017-12-05 DIAGNOSIS — J309 Allergic rhinitis, unspecified: Secondary | ICD-10-CM

## 2017-12-12 ENCOUNTER — Ambulatory Visit (INDEPENDENT_AMBULATORY_CARE_PROVIDER_SITE_OTHER): Payer: Federal, State, Local not specified - PPO

## 2017-12-12 DIAGNOSIS — J309 Allergic rhinitis, unspecified: Secondary | ICD-10-CM

## 2017-12-19 ENCOUNTER — Ambulatory Visit (INDEPENDENT_AMBULATORY_CARE_PROVIDER_SITE_OTHER): Payer: Federal, State, Local not specified - PPO | Admitting: *Deleted

## 2017-12-19 DIAGNOSIS — J309 Allergic rhinitis, unspecified: Secondary | ICD-10-CM

## 2017-12-26 ENCOUNTER — Ambulatory Visit (INDEPENDENT_AMBULATORY_CARE_PROVIDER_SITE_OTHER): Payer: Federal, State, Local not specified - PPO

## 2017-12-26 DIAGNOSIS — J309 Allergic rhinitis, unspecified: Secondary | ICD-10-CM

## 2018-01-02 ENCOUNTER — Ambulatory Visit (INDEPENDENT_AMBULATORY_CARE_PROVIDER_SITE_OTHER): Payer: Federal, State, Local not specified - PPO | Admitting: *Deleted

## 2018-01-02 DIAGNOSIS — J309 Allergic rhinitis, unspecified: Secondary | ICD-10-CM | POA: Diagnosis not present

## 2018-01-04 ENCOUNTER — Ambulatory Visit (INDEPENDENT_AMBULATORY_CARE_PROVIDER_SITE_OTHER): Payer: Federal, State, Local not specified - PPO | Admitting: Primary Care

## 2018-01-04 ENCOUNTER — Encounter: Payer: Self-pay | Admitting: Primary Care

## 2018-01-04 DIAGNOSIS — G4733 Obstructive sleep apnea (adult) (pediatric): Secondary | ICD-10-CM | POA: Diagnosis not present

## 2018-01-04 NOTE — Progress Notes (Signed)
@Patient  ID: Kevin [name], male    DOB: 03/08/1956, 62 y.o.   MRN: [id number]  Chief Complaint  Patient presents with  . Follow-up    moved to pillows    Referring provider: Gareth MorganKnowlton, Steve, MD  HPI: 62 year old male, never smoked. PMH mild intermittent asthma, seasonal allergic rhinitis. Followed by Dr. Wynona Neatlalere for new OSA, last seen on 11/23/17. Split night sleep study on 10/25/17 showed AHI 15/hr; setting of 15cm H20 eliminated all apnea.   01/04/2018 Patient presents today for fu visit new start CPAP. Patient received new mask on 08/1, states that he's using "nasal pillows" but sounds more that he is using newer full face mask. CPAP machine is on nasal pillow setting. Continues to report poor mask seal and air leaks. He has a very narrow face and feels air leaking on the side. He does feel better with CPAP use, able to sleep through the night and reports improved daytime symptoms. No skin blisters.   Airview download: Showed 100% compliance  Pressure set at 15cm 95th % air leaks- 37.5L/min Obstructive events 6.9 AHI 11.0    No Known Allergies  Immunization History  Administered Date(s) Administered  . Influenza Split 02/24/2012, 01/23/2013  . Influenza Whole 01/29/2009, 01/09/2015  . Influenza,inj,Quad PF,6+ Mos 02/01/2017  . Influenza-Unspecified 01/29/2014    Past Medical History:  Diagnosis Date  . ALLERGIC RHINITIS   . Asthma   . Dyslipidemia   . Family history of heart disease   . Hepatitis    3rd Grade  . History of insomnia   . History of nuclear stress test 08/2007    negative bruce myoview  . History of palpitations     Tobacco History: Social History   Tobacco Use  Smoking Status Never Smoker  Smokeless Tobacco Never Used   Counseling given: Not Answered   Outpatient Medications Prior to Visit  Medication Sig Dispense Refill  . albuterol (PROAIR HFA) 108 (90 Base) MCG/ACT inhaler USE 2 INHALATIONS ORALLY 4 TIMES DAILY AS NEEDED (Patient taking  differently: Inhale 2 puffs into the lungs every 4 (four) hours as needed for wheezing or shortness of breath. USE 2 INHALATIONS ORALLY 4 TIMES DAILY AS NEEDED) 1 Inhaler 0  . budesonide (RHINOCORT ALLERGY) 32 MCG/ACT nasal spray Place 1 spray into both nostrils daily as needed for allergies.     . budesonide-formoterol (SYMBICORT) 160-4.5 MCG/ACT inhaler Inhale 2 puffs into the lungs 2 (two) times daily. 3 Inhaler 1  . cromolyn (NASALCROM) 5.2 MG/ACT nasal spray Place 1 spray into both nostrils daily as needed for allergies.     . fexofenadine (ALLEGRA) 180 MG tablet Take 180 mg by mouth daily as needed for allergies.     . Methylcellulose, Laxative, 500 MG TABS Take 2 tablets by mouth daily.    . montelukast (SINGULAIR) 10 MG tablet Take 1 tablet (10 mg total) by mouth at bedtime. 90 tablet 1  . omeprazole (PRILOSEC) 20 MG capsule Take 1 capsule (20 mg total) by mouth daily. 90 capsule 1  . PRESCRIPTION MEDICATION ALLERGY VACCINE -- Use as directed weekly.    . ranitidine (ZANTAC) 300 MG tablet Take 1 tablet (300 mg total) by mouth at bedtime. 90 tablet 1  . SAW PALMETTO, SERENOA REPENS, PO Take 2 capsules by mouth daily.    . simvastatin (ZOCOR) 20 MG tablet Take 1 tablet (20 mg total) by mouth at bedtime. 30 tablet 0   No facility-administered medications prior to visit.  Review of Systems  Review of Systems  Constitutional: Negative.  Negative for fatigue.  Respiratory: Negative.   Cardiovascular: Negative.   Psychiatric/Behavioral: Negative for sleep disturbance.    Physical Exam  BP 118/88 (BP Location: Left Arm, Cuff Size: Normal)   Pulse 83   Ht 5\' 9"  (1.753 m)   Wt 172 lb 3.2 oz (78.1 kg)   SpO2 99%   BMI 25.43 kg/m  Physical Exam  Constitutional: He is oriented to person, place, and time. He appears well-developed and well-nourished.  HENT:  Head: Normocephalic and atraumatic.  Mallampati class II-III  Eyes: Pupils are equal, round, and reactive to light. EOM are  normal.  Neck: Normal range of motion. Neck supple.  Cardiovascular: Normal rate, regular rhythm, normal heart sounds and intact distal pulses.  Pulmonary/Chest: Effort normal and breath sounds normal. No respiratory distress. He has no wheezes.  Abdominal: Soft. Bowel sounds are normal. There is no tenderness.  Neurological: He is alert and oriented to person, place, and time.  Skin: Skin is warm and dry. No rash noted. No erythema.  Psychiatric: He has a normal mood and affect. His behavior is normal. Judgment normal.     Lab Results:  CBC No results found for: WBC, RBC, HGB, HCT, PLT, MCV, MCH, MCHC, RDW, LYMPHSABS, MONOABS, EOSABS, BASOSABS  BMET No results found for: NA, K, CL, CO2, GLUCOSE, BUN, CREATININE, CALCIUM, GFRNONAA, GFRAA  BNP No results found for: BNP  ProBNP No results found for: PROBNP  Imaging: No results found.   Assessment & Plan:   OSA (obstructive sleep apnea) - 100% compliance with CPAP/ AHI 11.0  - Patient reports improvement in sleep and daytime symptoms with use, however, he has been having trouble getting mask to seal with frequent airleaks - Given sample of SM full face mask today - Recommend patient adjust CPAP machine to full face mask setting from nasal pillow setting  - Plan to review download in 3-4 weeks, if seal better with smaller mask and still having high AHI readings will recommend changing pressure setting to 16cm H20  - Schedule visit in 3 months with Dr. Daleen Squibb, NP 01/04/2018

## 2018-01-04 NOTE — Patient Instructions (Signed)
FU in 3 months with Dr. Wynona Neatlalere  Will will pull cpap download in 4 weeks to review   Samples of small full facemask given

## 2018-01-04 NOTE — Assessment & Plan Note (Signed)
-   100% compliance with CPAP/ AHI 11.0  - Patient reports improvement in sleep and daytime symptoms with use, however, he has been having trouble getting mask to seal with frequent airleaks - Given sample of SM full face mask today - Recommend patient adjust CPAP machine to full face mask setting from nasal pillow setting  - Plan to review download in 3-4 weeks, if seal better with smaller mask and still having high AHI readings will recommend changing pressure setting to 16cm H20  - Schedule visit in 3 months with Dr. Wynona Neatlalere

## 2018-01-09 ENCOUNTER — Ambulatory Visit (INDEPENDENT_AMBULATORY_CARE_PROVIDER_SITE_OTHER): Payer: Federal, State, Local not specified - PPO | Admitting: *Deleted

## 2018-01-09 DIAGNOSIS — J309 Allergic rhinitis, unspecified: Secondary | ICD-10-CM | POA: Diagnosis not present

## 2018-01-16 ENCOUNTER — Ambulatory Visit (INDEPENDENT_AMBULATORY_CARE_PROVIDER_SITE_OTHER): Payer: Federal, State, Local not specified - PPO

## 2018-01-16 DIAGNOSIS — J309 Allergic rhinitis, unspecified: Secondary | ICD-10-CM

## 2018-01-23 ENCOUNTER — Ambulatory Visit: Payer: Federal, State, Local not specified - PPO | Admitting: Allergy and Immunology

## 2018-01-23 ENCOUNTER — Encounter: Payer: Self-pay | Admitting: Allergy and Immunology

## 2018-01-23 ENCOUNTER — Ambulatory Visit: Payer: Self-pay | Admitting: *Deleted

## 2018-01-23 VITALS — BP 118/70 | HR 87 | Resp 16 | Ht 69.0 in | Wt 174.2 lb

## 2018-01-23 DIAGNOSIS — J4540 Moderate persistent asthma, uncomplicated: Secondary | ICD-10-CM

## 2018-01-23 DIAGNOSIS — J309 Allergic rhinitis, unspecified: Secondary | ICD-10-CM

## 2018-01-23 DIAGNOSIS — J3089 Other allergic rhinitis: Secondary | ICD-10-CM | POA: Diagnosis not present

## 2018-01-23 DIAGNOSIS — G4733 Obstructive sleep apnea (adult) (pediatric): Secondary | ICD-10-CM

## 2018-01-23 DIAGNOSIS — K219 Gastro-esophageal reflux disease without esophagitis: Secondary | ICD-10-CM | POA: Diagnosis not present

## 2018-01-23 MED ORDER — MONTELUKAST SODIUM 10 MG PO TABS: 10.0000 mg | ORAL_TABLET | Freq: Every day | ORAL | 1 refills | Status: DC

## 2018-01-23 MED ORDER — OMEPRAZOLE 20 MG PO CPDR: 20.0000 mg | DELAYED_RELEASE_CAPSULE | Freq: Every day | ORAL | 1 refills | Status: DC

## 2018-01-23 MED ORDER — RANITIDINE HCL 300 MG PO TABS: 300.0000 mg | ORAL_TABLET | Freq: Every day | ORAL | 1 refills | Status: DC

## 2018-01-23 NOTE — Progress Notes (Signed)
Follow-up Note  Referring Provider: Gareth Morgan, MD Primary Provider: Gareth Morgan, MD Date of Office Visit: 01/23/2018  Subjective:   Kevin [name] (DOB: 1955-11-17) is a 62 y.o. male who [facility] in re-evaluation of the following:  HPI: Kevin [name] returns to this clinic in reevaluation of his asthma and allergic rhinoconjunctivitis and LPR and sleep apnea.  He was last seen in this clinic 15 August 2017.  Overall he has done relatively well and has not required a systemic steroid or antibiotic since I last saw him in his clinic for any type of respiratory tract issue.  He has not had to use a short acting bronchodilator.  He believes that his nose is doing relatively well at this point.  He believes that his reflux is under very good control as well at this point.  During the interval we did obtain a sleep study which identified sleep severe sleep apnea and he is now using CPAP.  He is on his third mask.  He is getting some adequate results from his CPAP use.  He is not very tired during the daytime now and does not need to take a nap.  He still has some difficulty with getting a good seal on the mask while he sleeps at nighttime.  He did visit with our local sleep pulmonologist.  He has started a course of immunotherapy without any adverse effects.  Allergies as of 01/23/2018   No Known Allergies     Medication List      albuterol 108 (90 Base) MCG/ACT inhaler Commonly known as:  PROVENTIL HFA;VENTOLIN HFA USE 2 INHALATIONS ORALLY 4 TIMES DAILY AS NEEDED   budesonide-formoterol 160-4.5 MCG/ACT inhaler Commonly known as:  SYMBICORT Inhale 2 puffs into the lungs 2 (two) times daily.   fexofenadine 180 MG tablet Commonly known as:  ALLEGRA Take 180 mg by mouth daily as needed for allergies.   Methylcellulose (Laxative) 500 MG Tabs Take 2 tablets by mouth daily.   montelukast 10 MG tablet Commonly known as:  SINGULAIR Take 1  tablet (10 mg total) by mouth at bedtime.   NASALCROM 5.2 MG/ACT nasal spray Generic drug:  cromolyn Place 1 spray into both nostrils daily as needed for allergies.   omeprazole 20 MG capsule Commonly known as:  PRILOSEC Take 1 capsule (20 mg total) by mouth daily.   PRESCRIPTION MEDICATION ALLERGY VACCINE -- Use as directed weekly.   ranitidine 300 MG tablet Commonly known as:  ZANTAC Take 1 tablet (300 mg total) by mouth at bedtime.   RHINOCORT ALLERGY 32 MCG/ACT nasal spray Generic drug:  budesonide Place 1 spray into both nostrils daily as needed for allergies.   SAW PALMETTO (SERENOA REPENS) PO Take 2 capsules by mouth daily.   simvastatin 20 MG tablet Commonly known as:  ZOCOR Take 1 tablet (20 mg total) by mouth at bedtime.       Past Medical History:  Diagnosis Date  . ALLERGIC RHINITIS   . Asthma   . Dyslipidemia   . Family history of heart disease   . Hepatitis    3rd Grade  . History of insomnia   . History of nuclear stress test 08/2007    negative bruce myoview  . History of palpitations     Past Surgical History:  Procedure Laterality Date  . COLONOSCOPY N/A 07/12/2017   Procedure: COLONOSCOPY;  Surgeon: Corbin Ade, MD;  [facility];  Service:  Endoscopy;  Laterality: N/A;  . NASAL SEPTOPLASTY W/ TURBINOPLASTY  2000   deviated septum  . POLYPECTOMY  07/12/2017   Procedure: POLYPECTOMY;  Surgeon: Corbin Ade, MD;  Location[facility];  Service: Endoscopy;;  recto-sigmoid colon  . Sleep Study  07/11/2005   AHI during total sleep 3.68/hr, during REM sleep 5.52/hr  . TONSILLECTOMY    . TRANSTHORACIC ECHOCARDIOGRAM  09/13/2007   EF=>55%, borderline asymmetric LVH; mild MR/TR, normal RVSP; AV mildly sclerotic, trace AV regurg & trave pulm vavle regurg  . varicocoel      Review of systems negative except as noted in HPI / PMHx or noted below:  Review of Systems  Constitutional: Negative.   HENT: Negative.   Eyes: Negative.     Respiratory: Negative.   Cardiovascular: Negative.   Gastrointestinal: Negative.   Genitourinary: Negative.   Musculoskeletal: Negative.   Skin: Negative.   Neurological: Negative.   Endo/Heme/Allergies: Negative.   Psychiatric/Behavioral: Negative.      Objective:   Vitals:   01/23/18 1455  BP: 118/70  Pulse: 87  Resp: 16  SpO2: 98%   Height: 5\' 9"  (175.3 cm)  Weight: 174 lb 3.2 oz (79 kg)   Physical Exam  HENT:  Head: Normocephalic.  Right Ear: Tympanic membrane, external ear and ear canal normal.  Left Ear: Tympanic membrane, external ear and ear canal normal.  Nose: Nasal deformity (Deviated septum rightward) present. No mucosal edema or rhinorrhea.  Mouth/Throat: Uvula is midline, oropharynx is clear and moist and mucous membranes are normal. No oropharyngeal exudate.  Eyes: Conjunctivae are normal.  Neck: Trachea normal. No tracheal tenderness present. No tracheal deviation present. No thyromegaly present.  Cardiovascular: Normal rate, regular rhythm, S1 normal, S2 normal and normal heart sounds.  No murmur heard. Pulmonary/Chest: Breath sounds normal. No stridor. No respiratory distress. He has no wheezes. He has no rales.  Musculoskeletal: He exhibits no edema.  Lymphadenopathy:       Head (right side): No tonsillar adenopathy present.       Head (left side): No tonsillar adenopathy present.    He has no cervical adenopathy.  Neurological: He is alert.  Skin: No rash noted. He is not diaphoretic. No erythema. Nails show no clubbing.    Diagnostics:    Spirometry was performed and demonstrated an FEV1 of 4.22 at 127 % of predicted.  The patient had an Asthma Control Test with the following results: ACT Total Score: 24.    Assessment and Plan:   1. Asthma, moderate persistent, well-controlled   2. Perennial allergic rhinitis   3. LPRD (laryngopharyngeal reflux disease)   4. Obstructive sleep apnea syndrome     1. Continue immunotherapy  2. Continue  to Treat and prevent inflammation:   A. Symbicort 160 - 2 inhalations 1-2 times per day with spacer.  B. OTC Rhinocort one spray each nostril once 3-7 times per week  C. montelukast 10 mg one tablet once a day  3. Continue to Treat and prevent reflux:   A. minimize caffeine consumption.   B. OTC omeprazole 20 mg 1 tablet in a.m.  C. OTC ranitidine 300mg  or famotidine 40 mg in PM  4.  Continue CPAP for treatment of sleep apnea  5. If needed:   A. ProAir HFA 2 puffs every 4-6 hours  B. nasal saline spray  C. OTC antihistamine  6. Return to clinic in 6 months or earlier if problem  7.  Obtain fall flu vaccine   Mendy appears  to be doing relatively well on his current medical plan which includes immunotherapy and anti-inflammatory medications for his airway and therapy directed against reflux and treatment of his sleep apnea with CPAP.  He will continue on this plan and I will see him back in this clinic in 6 months or earlier if there is a problem.  Laurette Schimke, MD Allergy / Immunology [facility]

## 2018-01-23 NOTE — Patient Instructions (Addendum)
  1. Continue immunotherapy  2. Continue to Treat and prevent inflammation:   A. Symbicort 160 - 2 inhalations 1-2 times per day with spacer.  B. OTC Rhinocort one spray each nostril once 3-7 times per week  C. montelukast 10 mg one tablet once a day  3. Continue to Treat and prevent reflux:   A. minimize caffeine consumption.   B. OTC omeprazole 20 mg 1 tablet in a.m.  C. OTC ranitidine 300mg  or famotidine 40 mg in PM  4.  Continue CPAP for treatment of sleep apnea  5. If needed:   A. ProAir HFA 2 puffs every 4-6 hours  B. nasal saline spray  C. OTC antihistamine  6. Return to clinic in 6 months or earlier if problem  7.  Obtain fall flu vaccine

## 2018-01-24 ENCOUNTER — Encounter: Payer: Self-pay | Admitting: Allergy and Immunology

## 2018-01-30 ENCOUNTER — Ambulatory Visit (INDEPENDENT_AMBULATORY_CARE_PROVIDER_SITE_OTHER): Payer: Federal, State, Local not specified - PPO

## 2018-01-30 DIAGNOSIS — J309 Allergic rhinitis, unspecified: Secondary | ICD-10-CM

## 2018-02-07 ENCOUNTER — Ambulatory Visit (INDEPENDENT_AMBULATORY_CARE_PROVIDER_SITE_OTHER): Payer: Federal, State, Local not specified - PPO | Admitting: *Deleted

## 2018-02-07 DIAGNOSIS — J309 Allergic rhinitis, unspecified: Secondary | ICD-10-CM

## 2018-02-14 ENCOUNTER — Ambulatory Visit (INDEPENDENT_AMBULATORY_CARE_PROVIDER_SITE_OTHER): Payer: Federal, State, Local not specified - PPO

## 2018-02-14 DIAGNOSIS — J309 Allergic rhinitis, unspecified: Secondary | ICD-10-CM | POA: Diagnosis not present

## 2018-02-21 ENCOUNTER — Ambulatory Visit (INDEPENDENT_AMBULATORY_CARE_PROVIDER_SITE_OTHER): Payer: Federal, State, Local not specified - PPO | Admitting: *Deleted

## 2018-02-21 DIAGNOSIS — J309 Allergic rhinitis, unspecified: Secondary | ICD-10-CM | POA: Diagnosis not present

## 2018-02-28 ENCOUNTER — Other Ambulatory Visit: Payer: Self-pay | Admitting: Allergy and Immunology

## 2018-02-28 ENCOUNTER — Ambulatory Visit (INDEPENDENT_AMBULATORY_CARE_PROVIDER_SITE_OTHER): Payer: Federal, State, Local not specified - PPO | Admitting: *Deleted

## 2018-02-28 DIAGNOSIS — J309 Allergic rhinitis, unspecified: Secondary | ICD-10-CM | POA: Diagnosis not present

## 2018-03-07 ENCOUNTER — Ambulatory Visit (INDEPENDENT_AMBULATORY_CARE_PROVIDER_SITE_OTHER): Payer: Federal, State, Local not specified - PPO | Admitting: *Deleted

## 2018-03-07 DIAGNOSIS — J309 Allergic rhinitis, unspecified: Secondary | ICD-10-CM | POA: Diagnosis not present

## 2018-03-14 ENCOUNTER — Ambulatory Visit (INDEPENDENT_AMBULATORY_CARE_PROVIDER_SITE_OTHER): Payer: Federal, State, Local not specified - PPO | Admitting: *Deleted

## 2018-03-14 DIAGNOSIS — J309 Allergic rhinitis, unspecified: Secondary | ICD-10-CM | POA: Diagnosis not present

## 2018-03-21 ENCOUNTER — Ambulatory Visit (INDEPENDENT_AMBULATORY_CARE_PROVIDER_SITE_OTHER): Payer: Federal, State, Local not specified - PPO

## 2018-03-21 DIAGNOSIS — J309 Allergic rhinitis, unspecified: Secondary | ICD-10-CM

## 2018-03-28 ENCOUNTER — Ambulatory Visit (INDEPENDENT_AMBULATORY_CARE_PROVIDER_SITE_OTHER): Payer: Federal, State, Local not specified - PPO

## 2018-03-28 DIAGNOSIS — J309 Allergic rhinitis, unspecified: Secondary | ICD-10-CM | POA: Diagnosis not present

## 2018-04-04 ENCOUNTER — Ambulatory Visit (INDEPENDENT_AMBULATORY_CARE_PROVIDER_SITE_OTHER): Payer: Federal, State, Local not specified - PPO | Admitting: *Deleted

## 2018-04-04 DIAGNOSIS — J309 Allergic rhinitis, unspecified: Secondary | ICD-10-CM

## 2018-04-05 ENCOUNTER — Encounter: Payer: Self-pay | Admitting: Primary Care

## 2018-04-05 ENCOUNTER — Ambulatory Visit (INDEPENDENT_AMBULATORY_CARE_PROVIDER_SITE_OTHER): Payer: Federal, State, Local not specified - PPO | Admitting: Primary Care

## 2018-04-05 VITALS — BP 110/82 | HR 90 | Temp 97.9°F | Ht 69.0 in | Wt 171.0 lb

## 2018-04-05 DIAGNOSIS — G4733 Obstructive sleep apnea (adult) (pediatric): Secondary | ICD-10-CM | POA: Diagnosis not present

## 2018-04-05 NOTE — Patient Instructions (Signed)
Change CPAP pressure to 13cm H20 with DME   Call us in 3-4 weeks to let us know how you are doing with new pressure setting and mask   If still having difficulty will order mask desensitization study   Follow up in 6 months with Dr. Wynona Neatlalere

## 2018-04-05 NOTE — Progress Notes (Signed)
@Patient  ID: Kevin [name], male    DOB: 30-Jul-1955, 62 y.o.   MRN: [id number]  Chief Complaint  Patient presents with  . Follow-up    can't keep seal    Referring provider: Gareth Morgan, MD  HPI: 61 year old male, never smoked. PMH mild intermittent asthma, seasonal allergic rhinitis. Followed by Dr. Wynona Neat for new OSA, last seen on 11/23/17. Split night sleep study on 10/25/17 showed AHI 15/hr; setting of 15cm H20 eliminated all apnea.    Recent [facility] Encounter: 9/12/19Magdalene River NP Patient presents today for fu visit new start CPAP. Patient received new mask on 08/1, states that he's using "nasal pillows" but sounds more that he is using newer full face mask. CPAP machine is on nasal pillow setting. Continues to report poor mask seal and air leaks. He has a very narrow face and feels air leaking on the side. He does feel better with CPAP use, able to sleep through the night and reports improved daytime symptoms. No skin blisters.   Airview download: Showed 100% compliance  Pressure set at 15cm 95th % air leaks- 37.5L/min Obstructive events 6.9 AHI 11.0   04/06/2018 Patient presents today for 3 month follow-up OSA. Patient was doing well during last visit with 100% compliance, however, he was having mask fit issues. Doing well today, AHI has improved. He has a mask fitting with his DME company, they stated that he was between size small and medium masks. Reports that he is still experiencing a lot of air leaks with his mask and feels the pressure is too high. He is compliant and reports benefit from use.  Airview download 90 days: Usage 89/90 (99%), 88% > 4 hours Pressure setting 15cm H20  Air leaks - median 21.3L/min; 95 percentile was 64.2L/min Obstructive events- 3.3 AHI 6.6    No Known Allergies  Immunization History  Administered Date(s) Administered  . Influenza Split 02/24/2012, 01/23/2013  . Influenza Whole 01/29/2009, 01/09/2015  .  Influenza,inj,Quad PF,6+ Mos 02/01/2017  . Influenza-Unspecified 01/29/2014    Past Medical History:  Diagnosis Date  . ALLERGIC RHINITIS   . Asthma   . Dyslipidemia   . Family history of heart disease   . Hepatitis    3rd Grade  . History of insomnia   . History of nuclear stress test 08/2007    negative bruce myoview  . History of palpitations     Tobacco History: Social History   Tobacco Use  Smoking Status Never Smoker  Smokeless Tobacco Never Used   Counseling given: Not Answered   Outpatient Medications Prior to Visit  Medication Sig Dispense Refill  . albuterol (PROAIR HFA) 108 (90 Base) MCG/ACT inhaler USE 2 INHALATIONS ORALLY 4 TIMES DAILY AS NEEDED (Patient taking differently: Inhale 2 puffs into the lungs every 4 (four) hours as needed for wheezing or shortness of breath. USE 2 INHALATIONS ORALLY 4 TIMES DAILY AS NEEDED) 1 Inhaler 0  . budesonide (RHINOCORT ALLERGY) 32 MCG/ACT nasal spray Place 1 spray into both nostrils daily as needed for allergies.     . cromolyn (NASALCROM) 5.2 MG/ACT nasal spray Place 1 spray into both nostrils daily as needed for allergies.     Marland Kitchen guaiFENesin (MUCINEX) 600 MG 12 hr tablet Take 600 mg by mouth 2 (two) times daily.    . Methylcellulose, Laxative, 500 MG TABS Take 2 tablets by mouth daily.    . montelukast (SINGULAIR) 10 MG tablet Take 1 tablet (10 mg total) by mouth at  bedtime. 90 tablet 1  . omeprazole (PRILOSEC) 20 MG capsule Take 1 capsule (20 mg total) by mouth daily. 90 capsule 1  . PRESCRIPTION MEDICATION ALLERGY VACCINE -- Use as directed weekly.    . ranitidine (ZANTAC) 300 MG tablet Take 1 tablet (300 mg total) by mouth at bedtime. 90 tablet 1  . SAW PALMETTO, SERENOA REPENS, PO Take 2 capsules by mouth daily.    . simvastatin (ZOCOR) 20 MG tablet Take 1 tablet (20 mg total) by mouth at bedtime. 30 tablet 0  . SYMBICORT 160-4.5 MCG/ACT inhaler USE 2 INHALATIONS ORALLY   TWICE DAILY 30.6 g 1  . fexofenadine (ALLEGRA) 180  MG tablet Take 180 mg by mouth daily as needed for allergies.      No facility-administered medications prior to visit.     Review of Systems  Review of Systems  Constitutional: Negative.   Respiratory: Negative.   Cardiovascular: Negative.     Physical Exam  BP 110/82 (BP Location: Left Arm, Cuff Size: Normal)   Pulse 90   Temp 97.9 F (36.6 C)   Ht 5\' 9"  (1.753 m)   Wt 171 lb (77.6 kg)   SpO2 99%   BMI 25.25 kg/m  Physical Exam Constitutional:      Appearance: Normal appearance.  HENT:     Head: Normocephalic and atraumatic.     Comments: Mallampati class I-II    Nose: Nose normal.     Mouth/Throat:     Mouth: Mucous membranes are moist.     Pharynx: Oropharynx is clear.  Eyes:     Extraocular Movements: Extraocular movements intact.     Pupils: Pupils are equal, round, and reactive to light.  Neck:     Musculoskeletal: Normal range of motion and neck supple. No muscular tenderness.  Cardiovascular:     Rate and Rhythm: Normal rate and regular rhythm.  Pulmonary:     Effort: Pulmonary effort is normal.     Breath sounds: Normal breath sounds. No wheezing.  Lymphadenopathy:     Cervical: No cervical adenopathy.  Skin:    General: Skin is warm and dry.  Neurological:     General: No focal deficit present.     Mental Status: He is alert and oriented to person, place, and time.  Psychiatric:        Mood and Affect: Mood normal.        Behavior: Behavior normal.        Thought Content: Thought content normal.        Judgment: Judgment normal.      Lab Results:  CBC No results found for: WBC, RBC, HGB, HCT, PLT, MCV, MCH, MCHC, RDW, LYMPHSABS, MONOABS, EOSABS, BASOSABS  BMET No results found for: NA, K, CL, CO2, GLUCOSE, BUN, CREATININE, CALCIUM, GFRNONAA, GFRAA  BNP No results found for: BNP  ProBNP No results found for: PROBNP  Imaging: No results found.   Assessment & Plan:   OSA (obstructive sleep apnea) - Patient is compliant with CPAP  therapy, reports benefit from use - AHI improved to 6.6 - Continues to have moderate amount of air leaks and feels pressure is too high - Trial decrease pressure to 13cm H20  - Size medium mask sample - Patient to call in 3-4 weeks to let us know how he is doing with these changes and check download, if still having difficulties with mask fit and air leaks recommend mask desensitization study - FU in 6 months and as needed  Glenford BayleyElizabeth W Decarlo Rivet, NP 04/06/2018

## 2018-04-06 ENCOUNTER — Encounter: Payer: Self-pay | Admitting: Primary Care

## 2018-04-06 NOTE — Assessment & Plan Note (Signed)
-   Patient is compliant with CPAP therapy, reports benefit from use - AHI improved to 6.6 - Continues to have moderate amount of air leaks and feels pressure is too high - Trial decrease pressure to 13cm H20  - Size medium mask sample - Patient to call in 3-4 weeks to let us know how he is doing with these changes and check download, if still having difficulties with mask fit and air leaks recommend mask desensitization study - FU in 6 months and as needed

## 2018-04-11 ENCOUNTER — Ambulatory Visit (INDEPENDENT_AMBULATORY_CARE_PROVIDER_SITE_OTHER): Payer: Federal, State, Local not specified - PPO | Admitting: *Deleted

## 2018-04-11 DIAGNOSIS — J309 Allergic rhinitis, unspecified: Secondary | ICD-10-CM | POA: Diagnosis not present

## 2018-04-27 ENCOUNTER — Ambulatory Visit (INDEPENDENT_AMBULATORY_CARE_PROVIDER_SITE_OTHER): Payer: Federal, State, Local not specified - PPO

## 2018-04-27 DIAGNOSIS — J309 Allergic rhinitis, unspecified: Secondary | ICD-10-CM

## 2018-05-02 ENCOUNTER — Ambulatory Visit (INDEPENDENT_AMBULATORY_CARE_PROVIDER_SITE_OTHER): Payer: Federal, State, Local not specified - PPO | Admitting: *Deleted

## 2018-05-02 DIAGNOSIS — J309 Allergic rhinitis, unspecified: Secondary | ICD-10-CM

## 2018-05-09 ENCOUNTER — Ambulatory Visit (INDEPENDENT_AMBULATORY_CARE_PROVIDER_SITE_OTHER): Payer: Federal, State, Local not specified - PPO

## 2018-05-09 DIAGNOSIS — J309 Allergic rhinitis, unspecified: Secondary | ICD-10-CM

## 2018-05-10 DIAGNOSIS — J301 Allergic rhinitis due to pollen: Secondary | ICD-10-CM | POA: Diagnosis not present

## 2018-05-10 NOTE — Progress Notes (Signed)
VIALS EXP 05-11-2019

## 2018-05-14 ENCOUNTER — Other Ambulatory Visit: Payer: Self-pay | Admitting: Allergy and Immunology

## 2018-05-16 ENCOUNTER — Telehealth: Payer: Self-pay | Admitting: Primary Care

## 2018-05-16 ENCOUNTER — Ambulatory Visit (INDEPENDENT_AMBULATORY_CARE_PROVIDER_SITE_OTHER): Payer: Federal, State, Local not specified - PPO | Admitting: *Deleted

## 2018-05-16 DIAGNOSIS — J309 Allergic rhinitis, unspecified: Secondary | ICD-10-CM | POA: Diagnosis not present

## 2018-05-16 NOTE — Telephone Encounter (Signed)
Reviewed download; pressure recently decreased to 13cm H20. 100% compliant, AHI improved 3.2 (6.6). Nothing further needed

## 2018-05-23 ENCOUNTER — Ambulatory Visit (INDEPENDENT_AMBULATORY_CARE_PROVIDER_SITE_OTHER): Payer: Federal, State, Local not specified - PPO | Admitting: *Deleted

## 2018-05-23 DIAGNOSIS — J309 Allergic rhinitis, unspecified: Secondary | ICD-10-CM

## 2018-05-30 ENCOUNTER — Ambulatory Visit (INDEPENDENT_AMBULATORY_CARE_PROVIDER_SITE_OTHER): Payer: Federal, State, Local not specified - PPO

## 2018-05-30 DIAGNOSIS — J309 Allergic rhinitis, unspecified: Secondary | ICD-10-CM | POA: Diagnosis not present

## 2018-06-04 ENCOUNTER — Telehealth: Payer: Self-pay | Admitting: Pulmonary Disease

## 2018-06-04 DIAGNOSIS — G4733 Obstructive sleep apnea (adult) (pediatric): Secondary | ICD-10-CM

## 2018-06-04 NOTE — Telephone Encounter (Signed)
Called and spoke with patient, he stated that he is not having trouble with his machine he is having trouble getting a good seal with his face mask. He was recently seen by Buelah Manis, NP and had his pressure reduced from 15 to 13 and he stated that it made a huge difference however now he is still having the same trouble. He is wanting to know if he needs to be fitted for a different mask or if his pressure needs to be reduced again. Patient stated that he has been fitted for 5 different masks so he feels like the pressure needs to be reduced again. Patient called [facility] and they stated that they would need an order from [place]. Patient also had his CPAP checked by [facility] and they stated that there was nothing wrong with the machine. AO please advise, thank you.

## 2018-06-05 NOTE — Telephone Encounter (Signed)
Patient is aware nothing further is needed at this time. 

## 2018-06-05 NOTE — Telephone Encounter (Signed)
Reduce pressure from 13-11  We need compliance data on the 11 to ascertain that the pressure of 11 is helping with the sleep disordered breathing events--2 to 4 weeks of data-on new pressure

## 2018-06-06 ENCOUNTER — Ambulatory Visit (INDEPENDENT_AMBULATORY_CARE_PROVIDER_SITE_OTHER): Payer: Federal, State, Local not specified - PPO

## 2018-06-06 DIAGNOSIS — J309 Allergic rhinitis, unspecified: Secondary | ICD-10-CM

## 2018-06-13 ENCOUNTER — Ambulatory Visit (INDEPENDENT_AMBULATORY_CARE_PROVIDER_SITE_OTHER): Payer: Federal, State, Local not specified - PPO

## 2018-06-13 DIAGNOSIS — J309 Allergic rhinitis, unspecified: Secondary | ICD-10-CM

## 2018-06-20 ENCOUNTER — Ambulatory Visit (INDEPENDENT_AMBULATORY_CARE_PROVIDER_SITE_OTHER): Payer: Federal, State, Local not specified - PPO | Admitting: *Deleted

## 2018-06-20 DIAGNOSIS — J309 Allergic rhinitis, unspecified: Secondary | ICD-10-CM | POA: Diagnosis not present

## 2018-06-27 ENCOUNTER — Ambulatory Visit (INDEPENDENT_AMBULATORY_CARE_PROVIDER_SITE_OTHER): Payer: Federal, State, Local not specified - PPO

## 2018-06-27 DIAGNOSIS — J309 Allergic rhinitis, unspecified: Secondary | ICD-10-CM

## 2018-06-28 ENCOUNTER — Ambulatory Visit: Payer: Federal, State, Local not specified - PPO | Admitting: Pulmonary Disease

## 2018-06-28 ENCOUNTER — Encounter: Payer: Self-pay | Admitting: Pulmonary Disease

## 2018-06-28 VITALS — BP 112/84 | HR 77 | Ht 69.0 in | Wt 170.0 lb

## 2018-06-28 DIAGNOSIS — G4733 Obstructive sleep apnea (adult) (pediatric): Secondary | ICD-10-CM

## 2018-06-28 DIAGNOSIS — Z9989 Dependence on other enabling machines and devices: Secondary | ICD-10-CM | POA: Diagnosis not present

## 2018-06-28 NOTE — Patient Instructions (Signed)
Obstructive sleep apnea adequately treated with CPAP therapy  We will send a prescription to the DME company to reduce CPAP pressure to 10  I will see you back in the office in about 3 months  Call with significant concerns

## 2018-06-28 NOTE — Progress Notes (Signed)
Kevin [name]    [id number]    1956-01-17  Primary Care Physician:Knowlton, Brett Canales, MD  Referring Physician: Gareth Morgan, MD [address] [place], [place] [address]  Chief complaint:   Recently diagnosed with obstructive sleep apnea, has been using CPAP for a few days Mask is working better Reducing pressure from 13-11 also working well for him  HPI:  Diagnosed with obstructive sleep apnea which is been using CPAP therapy Has a history of allergies  CPAP of 11 working better, he still feels that the pressure may be reduced a little bit to tolerate CPAP better Mask difficulties are improved  Was initially titrated to CPAP of 15  So far having significant problems with mask fit-has improved with current mask and with reduction of pressure to 11  Occupation: No pertinent history Exposures: Exposure Smoking history: Smoker Travel history: No recent travel Relevant family history:  Outpatient Encounter Medications as of 06/28/2018  Medication Sig  . albuterol (PROAIR HFA) 108 (90 Base) MCG/ACT inhaler USE 2 INHALATIONS ORALLY 4 TIMES DAILY AS NEEDED (Patient taking differently: Inhale 2 puffs into the lungs every 4 (four) hours as needed for wheezing or shortness of breath. USE 2 INHALATIONS ORALLY 4 TIMES DAILY AS NEEDED)  . budesonide (RHINOCORT ALLERGY) 32 MCG/ACT nasal spray Place 1 spray into both nostrils daily as needed for allergies.   . cromolyn (NASALCROM) 5.2 MG/ACT nasal spray Place 1 spray into both nostrils daily as needed for allergies.   . fexofenadine (ALLEGRA) 180 MG tablet Take 180 mg by mouth daily as needed for allergies.   Marland Kitchen guaiFENesin (MUCINEX) 600 MG 12 hr tablet Take 600 mg by mouth 2 (two) times daily.  . Methylcellulose, Laxative, 500 MG TABS Take 2 tablets by mouth daily.  . montelukast (SINGULAIR) 10 MG tablet Take 1 tablet (10 mg total) by mouth at bedtime.  Marland Kitchen omeprazole (PRILOSEC) 20 MG capsule Take 1 capsule (20 mg total) by mouth  daily.  Marland Kitchen PRESCRIPTION MEDICATION ALLERGY VACCINE -- Use as directed weekly.  . ranitidine (ZANTAC) 300 MG tablet TAKE 1 TABLET BY MOUTH AT BEDTIME  . SAW PALMETTO, SERENOA REPENS, PO Take 2 capsules by mouth daily.  . simvastatin (ZOCOR) 20 MG tablet Take 1 tablet (20 mg total) by mouth at bedtime.  . SYMBICORT 160-4.5 MCG/ACT inhaler USE 2 INHALATIONS ORALLY   TWICE DAILY   No facility-administered encounter medications on file as of 06/28/2018.     Allergies as of 06/28/2018  . (No Known Allergies)    Past Medical History:  Diagnosis Date  . ALLERGIC RHINITIS   . Asthma   . Dyslipidemia   . Family history of heart disease   . Hepatitis    3rd Grade  . History of insomnia   . History of nuclear stress test 08/2007    negative bruce myoview  . History of palpitations     Past Surgical History:  Procedure Laterality Date  . COLONOSCOPY N/A 07/12/2017   Procedure: COLONOSCOPY;  Surgeon: Corbin Ade, MD;  [facility];  Service: Endoscopy;  Laterality: N/A;  . NASAL SEPTOPLASTY W/ TURBINOPLASTY  2000   deviated septum  . POLYPECTOMY  07/12/2017   Procedure: POLYPECTOMY;  Surgeon: Corbin Ade, MD;  [facility];  Service: Endoscopy;;  recto-sigmoid colon  . Sleep Study  07/11/2005   AHI during total sleep 3.68/hr, during REM sleep 5.52/hr  . TONSILLECTOMY    . TRANSTHORACIC ECHOCARDIOGRAM  09/13/2007   EF=>55%, borderline asymmetric LVH; mild MR/TR, normal RVSP; AV mildly sclerotic, trace AV regurg & trave pulm vavle regurg  . varicocoel      Family History  Problem Relation Age of Onset  . Heart attack Father [id number]       also CHF & lung problems  . Heart attack Brother 55  . Heart attack Brother 55  . Heart attack Maternal Grandmother   . Thyroid cancer Maternal Grandfather   . Allergic rhinitis Neg Hx   . Angioedema Neg Hx   . Asthma Neg Hx   . Eczema Neg Hx   . Immunodeficiency Neg Hx   . Colon cancer Neg Hx     Social History    Socioeconomic History  . Marital status: Married    Spouse name: Not on file  . Number of children: 2  . Years of education: Not on file  . Highest education level: Not on file  Occupational History  . 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 Financial resource strain: Not on file  . Food insecurity:    Worry: Not on file    Inability: Not on file  . Transportation needs:    Medical: Not on file    Non-medical: Not on file  Tobacco Use  . Smoking status: Never Smoker  . Smokeless tobacco: Never Used  Substance and Sexual Activity  . Alcohol use: No  . Drug use: No  . Sexual activity: Not on file  Lifestyle  . Physical activity:    Days per week: Not on file    Minutes per session: Not on file  . Stress: Not on file  Relationships  . Social connections:    Talks on phone: Not on file    Gets together: Not on file    Attends religious service: Not on file    Active member of club or organization: Not on file    Attends meetings of clubs or organizations: Not on file    Relationship status: Not on file  . Intimate partner violence:    Fear of current or ex partner: Not on file    Emotionally abused: Not on file    Physically abused: Not on file    Forced sexual activity: Not on file  Other Topics Concern  . Not on file  Social History Narrative  . Not on file    Review of Systems  Constitutional: Negative.   HENT: Positive for congestion.   Respiratory: Positive for apnea. Negative for cough.   Cardiovascular: Negative.   Endocrine: Negative.   Psychiatric/Behavioral: Positive for sleep disturbance.  All other systems reviewed and are negative.   There were no vitals filed for this visit.   Physical Exam  Constitutional: He appears well-developed and well-nourished.  HENT:  Blister on his nasal bridge  Eyes: Pupils are equal, round, and reactive to light. Conjunctivae are normal. Right eye exhibits no discharge. Left eye exhibits no  discharge.  Neck: Normal range of motion. Neck supple. No thyromegaly present.  Cardiovascular: Normal rate and regular rhythm.  Pulmonary/Chest: Effort normal and breath sounds normal. No respiratory distress. He has no wheezes. He has no rales.  Abdominal: Soft. Bowel sounds are normal. He exhibits no distension. There is no abdominal tenderness.     Data Reviewed: CPAP download reviewed showing 100% compliance He is on CPAP of 11 AHI of 3.0  still does have some leaks  Assessment:  Obstructive sleep apnea -Adequately treated with  CPAP therapy -Pressures may be reduced further for improved tolerance  History of rhinitis Seasonal allergies -Symptoms are better at present  Plan/Recommendations:  Continue inhaler use  Reduce CPAP to 10  Will obtain a download from his machine in about 4 weeks  Will see him back in the office in about 3 months  Virl Diamond MD [facility] 06/28/2018, 11:26 AM  CC: Gareth Morgan, MD

## 2018-07-04 ENCOUNTER — Ambulatory Visit (INDEPENDENT_AMBULATORY_CARE_PROVIDER_SITE_OTHER): Payer: Federal, State, Local not specified - PPO

## 2018-07-04 DIAGNOSIS — J309 Allergic rhinitis, unspecified: Secondary | ICD-10-CM | POA: Diagnosis not present

## 2018-07-06 ENCOUNTER — Telehealth: Payer: Self-pay | Admitting: Pulmonary Disease

## 2018-07-06 DIAGNOSIS — Z9989 Dependence on other enabling machines and devices: Secondary | ICD-10-CM

## 2018-07-06 DIAGNOSIS — G4733 Obstructive sleep apnea (adult) (pediatric): Secondary | ICD-10-CM

## 2018-07-06 NOTE — Telephone Encounter (Signed)
I have talked with the patient and sent this order in nothing further is needed at this time.

## 2018-07-11 ENCOUNTER — Ambulatory Visit (INDEPENDENT_AMBULATORY_CARE_PROVIDER_SITE_OTHER): Payer: Federal, State, Local not specified - PPO | Admitting: *Deleted

## 2018-07-11 DIAGNOSIS — J309 Allergic rhinitis, unspecified: Secondary | ICD-10-CM

## 2018-07-18 ENCOUNTER — Ambulatory Visit (INDEPENDENT_AMBULATORY_CARE_PROVIDER_SITE_OTHER): Payer: Federal, State, Local not specified - PPO | Admitting: *Deleted

## 2018-07-18 DIAGNOSIS — J309 Allergic rhinitis, unspecified: Secondary | ICD-10-CM

## 2018-07-25 ENCOUNTER — Ambulatory Visit (INDEPENDENT_AMBULATORY_CARE_PROVIDER_SITE_OTHER): Payer: Federal, State, Local not specified - PPO

## 2018-07-25 DIAGNOSIS — J309 Allergic rhinitis, unspecified: Secondary | ICD-10-CM | POA: Diagnosis not present

## 2018-07-31 ENCOUNTER — Ambulatory Visit: Payer: Federal, State, Local not specified - PPO | Admitting: Allergy and Immunology

## 2018-08-05 ENCOUNTER — Other Ambulatory Visit: Payer: Self-pay | Admitting: Allergy & Immunology

## 2018-08-07 ENCOUNTER — Other Ambulatory Visit: Payer: Self-pay

## 2018-08-07 ENCOUNTER — Ambulatory Visit (INDEPENDENT_AMBULATORY_CARE_PROVIDER_SITE_OTHER): Payer: Federal, State, Local not specified - PPO | Admitting: Allergy and Immunology

## 2018-08-07 DIAGNOSIS — J3089 Other allergic rhinitis: Secondary | ICD-10-CM

## 2018-08-07 DIAGNOSIS — K219 Gastro-esophageal reflux disease without esophagitis: Secondary | ICD-10-CM

## 2018-08-07 DIAGNOSIS — G4733 Obstructive sleep apnea (adult) (pediatric): Secondary | ICD-10-CM

## 2018-08-07 DIAGNOSIS — J4540 Moderate persistent asthma, uncomplicated: Secondary | ICD-10-CM

## 2018-08-07 MED ORDER — BUDESONIDE-FORMOTEROL FUMARATE 160-4.5 MCG/ACT IN AERO: 2.0000 | INHALATION_SPRAY | Freq: Two times a day (BID) | RESPIRATORY_TRACT | 1 refills | Status: DC

## 2018-08-07 MED ORDER — MONTELUKAST SODIUM 10 MG PO TABS: 10.0000 mg | ORAL_TABLET | Freq: Every day | ORAL | 1 refills | Status: DC

## 2018-08-07 MED ORDER — ALBUTEROL SULFATE HFA 108 (90 BASE) MCG/ACT IN AERS: INHALATION_SPRAY | RESPIRATORY_TRACT | 1 refills | Status: DC

## 2018-08-07 NOTE — Patient Instructions (Addendum)
  1. Continue immunotherapy  2. Continue to Treat and prevent inflammation:   A. Symbicort 160 - 2 inhalations 1-2 times per day with spacer.  B. OTC Rhinocort / Flonase 1 spray each nostril once 3-7 times per week  C. montelukast 10 mg one tablet once a day  3. Continue to Treat and prevent reflux:   A. minimize caffeine consumption.   B. OTC omeprazole 20 mg 1 tablet in a.m.  C. famotidine 40 mg in PM  4.  Continue CPAP for treatment of sleep apnea  5. If needed:   A. ProAir HFA 2 puffs every 4-6 hours  B. nasal saline spray  C. OTC antihistamine  6. Return to clinic in 6 months or earlier if problem

## 2018-08-07 NOTE — Progress Notes (Signed)
[place] - [place] - [place] - [place] - [place]   Follow-up Note  Referring Provider: Gareth Morgan, MD Primary Provider: Gareth Morgan, MD Date of Office Visit: 08/07/2018  Subjective:   Kevin [name] (DOB: 07/04/1955) is a 63 y.o. male who [facility] in re-evaluation of the following:  HPI: This is a E - Med visit requested by patient who is located at home.  Kevin [name] is followed in this clinic for asthma and allergic rhinitis and LPR and obstructive sleep apnea.  His last visit to this clinic was 23 January 2018.  Immunotherapy currently at every weeks without adverse effect.  Using Symbicort twice a day. Exercise no problem. No cold air intolerance. Nose doing well. Using Flonase this spring and montelukast daily. No Systemic steroids or antibiotic use.   CPAP working OK after extensive manipulation of masks and lowering pressure to 10.  Reflux OK while using PPI and H2 receptor blocker.  Did receive flu vaccine this year.  Allergies as of 08/07/2018   No Known Allergies     Medication List      albuterol 108 (90 Base) MCG/ACT inhaler Commonly known as:  ProAir HFA USE 2 INHALATIONS ORALLY 4 TIMES DAILY AS NEEDED   fexofenadine 180 MG tablet Commonly known as:  ALLEGRA Take 180 mg by mouth daily as needed for allergies.   guaiFENesin 600 MG 12 hr tablet Commonly known as:  MUCINEX Take 600 mg by mouth 2 (two) times daily.   Methylcellulose (Laxative) 500 MG Tabs Take 2 tablets by mouth daily.   montelukast 10 MG tablet Commonly known as:  SINGULAIR Take 1 tablet (10 mg total) by mouth at bedtime.   NasalCrom 5.2 MG/ACT nasal spray Generic drug:  cromolyn Place 1 spray into both nostrils daily as needed for allergies.   omeprazole 20 MG capsule Commonly known as:  PRILOSEC Take 1 capsule (20 mg total) by mouth daily.   PRESCRIPTION MEDICATION ALLERGY VACCINE -- Use as directed weekly.   ranitidine  300 MG tablet Commonly known as:  ZANTAC TAKE 1 TABLET BY MOUTH AT BEDTIME   Rhinocort Allergy 32 MCG/ACT nasal spray Generic drug:  budesonide Place 1 spray into both nostrils daily as needed for allergies.   SAW PALMETTO (SERENOA REPENS) PO Take 2 capsules by mouth daily.   simvastatin 20 MG tablet Commonly known as:  ZOCOR Take 1 tablet (20 mg total) by mouth at bedtime.   Symbicort 160-4.5 MCG/ACT inhaler Generic drug:  budesonide-formoterol USE 2 INHALATIONS ORALLY   TWICE DAILY       Past Medical History:  Diagnosis Date  . ALLERGIC RHINITIS   . Asthma   . Dyslipidemia   . Family history of heart disease   . Hepatitis    3rd Grade  . History of insomnia   . History of nuclear stress test 08/2007    negative bruce myoview  . History of palpitations     Past Surgical History:  Procedure Laterality Date  . COLONOSCOPY N/A 07/12/2017   Procedure: COLONOSCOPY;  Surgeon: Corbin Ade, MD;  [facility];  Service: Endoscopy;  Laterality: N/A;  . NASAL SEPTOPLASTY W/ TURBINOPLASTY  2000   deviated septum  . POLYPECTOMY  07/12/2017   Procedure: POLYPECTOMY;  Surgeon: Corbin Ade, MD;  [facility];  Service: Endoscopy;;  recto-sigmoid colon  . Sleep Study  07/11/2005   AHI during total sleep 3.68/hr, during REM sleep 5.52/hr  .  TONSILLECTOMY    . TRANSTHORACIC ECHOCARDIOGRAM  09/13/2007   EF=>55%, borderline asymmetric LVH; mild MR/TR, normal RVSP; AV mildly sclerotic, trace AV regurg & trave pulm vavle regurg  . varicocoel      Review of systems negative except as noted in HPI / PMHx or noted below:  Review of Systems  Constitutional: Negative.   HENT: Negative.   Eyes: Negative.   Respiratory: Negative.   Cardiovascular: Negative.   Gastrointestinal: Negative.   Genitourinary: Negative.   Musculoskeletal: Negative.   Skin: Negative.   Neurological: Negative.   Endo/Heme/Allergies: Negative.   Psychiatric/Behavioral: Negative.       Objective:   There were no vitals filed for this visit.        Physical Exam-deferred  Diagnostics: none  Assessment and Plan:   1. Asthma, moderate persistent, well-controlled   2. Perennial allergic rhinitis   3. LPRD (laryngopharyngeal reflux disease)   4. Obstructive sleep apnea syndrome     1. Continue immunotherapy  2. Continue to Treat and prevent inflammation:   A. Symbicort 160 - 2 inhalations 1-2 times per day with spacer.  B. OTC Rhinocort / Flonase 1 spray each nostril once 3-7 times per week  C. montelukast 10 mg one tablet once a day  3. Continue to Treat and prevent reflux:   A. minimize caffeine consumption.   B. OTC omeprazole 20 mg 1 tablet in a.m.  C. famotidine 40 mg in PM  4.  Continue CPAP for treatment of sleep apnea  5. If needed:   A. ProAir HFA 2 puffs every 4-6 hours  B. nasal saline spray  C. OTC antihistamine  6. Return to clinic in 6 months or earlier if problem  It sounds as though Kevin Fickleaul is really doing very well on his current therapy which includes a combination of anti-inflammatory medications delivered to his respiratory tract and therapy directed against reflux including a proton pump inhibitor and H2 receptor blocker as well as consistently using immunotherapy.  We will not change any of this therapy at this point in time.  He will remain on CPAP for sleep apnea.  I will see him back in this clinic in 6 months or earlier if there is a problem.  Total patient interaction time 20 minutes  Kevin SchimkeEric Avarie Tavano, MD Allergy / Immunology [facility]

## 2018-08-07 NOTE — Progress Notes (Addendum)
START OF VISIT: 1115 am CONSENT FOR VISIT:yes, by patient PLACE OF VISIT:home END OF VISIT:1137am

## 2018-08-08 ENCOUNTER — Other Ambulatory Visit: Payer: Self-pay | Admitting: Allergy and Immunology

## 2018-08-08 ENCOUNTER — Encounter: Payer: Self-pay | Admitting: Allergy and Immunology

## 2018-08-08 ENCOUNTER — Ambulatory Visit (INDEPENDENT_AMBULATORY_CARE_PROVIDER_SITE_OTHER): Payer: Federal, State, Local not specified - PPO | Admitting: *Deleted

## 2018-08-08 DIAGNOSIS — J309 Allergic rhinitis, unspecified: Secondary | ICD-10-CM | POA: Diagnosis not present

## 2018-08-12 ENCOUNTER — Other Ambulatory Visit: Payer: Self-pay | Admitting: Allergy and Immunology

## 2018-08-15 ENCOUNTER — Ambulatory Visit (INDEPENDENT_AMBULATORY_CARE_PROVIDER_SITE_OTHER): Payer: Federal, State, Local not specified - PPO

## 2018-08-15 DIAGNOSIS — J309 Allergic rhinitis, unspecified: Secondary | ICD-10-CM

## 2018-08-22 ENCOUNTER — Ambulatory Visit (INDEPENDENT_AMBULATORY_CARE_PROVIDER_SITE_OTHER): Payer: Federal, State, Local not specified - PPO | Admitting: *Deleted

## 2018-08-22 DIAGNOSIS — J309 Allergic rhinitis, unspecified: Secondary | ICD-10-CM

## 2018-08-22 NOTE — Progress Notes (Signed)
VIALS EXP 08-22-2019 

## 2018-08-23 DIAGNOSIS — J301 Allergic rhinitis due to pollen: Secondary | ICD-10-CM | POA: Diagnosis not present

## 2018-08-29 ENCOUNTER — Ambulatory Visit (INDEPENDENT_AMBULATORY_CARE_PROVIDER_SITE_OTHER): Payer: Federal, State, Local not specified - PPO | Admitting: *Deleted

## 2018-08-29 DIAGNOSIS — J309 Allergic rhinitis, unspecified: Secondary | ICD-10-CM | POA: Diagnosis not present

## 2018-09-05 ENCOUNTER — Ambulatory Visit (INDEPENDENT_AMBULATORY_CARE_PROVIDER_SITE_OTHER): Payer: Federal, State, Local not specified - PPO

## 2018-09-05 DIAGNOSIS — J309 Allergic rhinitis, unspecified: Secondary | ICD-10-CM

## 2018-09-12 ENCOUNTER — Ambulatory Visit (INDEPENDENT_AMBULATORY_CARE_PROVIDER_SITE_OTHER): Payer: Federal, State, Local not specified - PPO

## 2018-09-12 DIAGNOSIS — J309 Allergic rhinitis, unspecified: Secondary | ICD-10-CM

## 2018-09-19 ENCOUNTER — Ambulatory Visit (INDEPENDENT_AMBULATORY_CARE_PROVIDER_SITE_OTHER): Payer: Federal, State, Local not specified - PPO

## 2018-09-19 DIAGNOSIS — J309 Allergic rhinitis, unspecified: Secondary | ICD-10-CM

## 2018-09-26 ENCOUNTER — Ambulatory Visit (INDEPENDENT_AMBULATORY_CARE_PROVIDER_SITE_OTHER): Payer: Federal, State, Local not specified - PPO

## 2018-09-26 DIAGNOSIS — J309 Allergic rhinitis, unspecified: Secondary | ICD-10-CM | POA: Diagnosis not present

## 2018-09-28 ENCOUNTER — Ambulatory Visit: Payer: Federal, State, Local not specified - PPO | Admitting: Pulmonary Disease

## 2018-10-03 ENCOUNTER — Ambulatory Visit (INDEPENDENT_AMBULATORY_CARE_PROVIDER_SITE_OTHER): Payer: Federal, State, Local not specified - PPO | Admitting: *Deleted

## 2018-10-03 DIAGNOSIS — J309 Allergic rhinitis, unspecified: Secondary | ICD-10-CM

## 2018-10-17 ENCOUNTER — Ambulatory Visit (INDEPENDENT_AMBULATORY_CARE_PROVIDER_SITE_OTHER): Payer: Federal, State, Local not specified - PPO

## 2018-10-17 DIAGNOSIS — J309 Allergic rhinitis, unspecified: Secondary | ICD-10-CM

## 2018-10-31 ENCOUNTER — Other Ambulatory Visit: Payer: Self-pay

## 2018-10-31 ENCOUNTER — Ambulatory Visit (INDEPENDENT_AMBULATORY_CARE_PROVIDER_SITE_OTHER): Payer: Federal, State, Local not specified - PPO

## 2018-10-31 DIAGNOSIS — J309 Allergic rhinitis, unspecified: Secondary | ICD-10-CM

## 2018-11-03 ENCOUNTER — Other Ambulatory Visit: Payer: Self-pay | Admitting: Allergy and Immunology

## 2018-11-09 ENCOUNTER — Telehealth: Payer: Self-pay | Admitting: Internal Medicine

## 2018-11-09 NOTE — Telephone Encounter (Signed)
Pt returned call to conf appt 11/12/18

## 2018-11-09 NOTE — Telephone Encounter (Signed)
LVM, reminding pt of is appt on 11-12-18 with Dr Debara Pickett. I also  Left a message for pt to call and give consent for his Virtual Visit.l

## 2018-11-12 ENCOUNTER — Telehealth (INDEPENDENT_AMBULATORY_CARE_PROVIDER_SITE_OTHER): Payer: Federal, State, Local not specified - PPO | Admitting: Internal Medicine

## 2018-11-12 VITALS — BP 131/84 | HR 76 | Ht 69.0 in | Wt 170.0 lb

## 2018-11-12 DIAGNOSIS — Z8249 Family history of ischemic heart disease and other diseases of the circulatory system: Secondary | ICD-10-CM | POA: Diagnosis not present

## 2018-11-12 DIAGNOSIS — G4733 Obstructive sleep apnea (adult) (pediatric): Secondary | ICD-10-CM

## 2018-11-12 DIAGNOSIS — E7800 Pure hypercholesterolemia, unspecified: Secondary | ICD-10-CM

## 2018-11-12 NOTE — Patient Instructions (Signed)
Medication Instructions:  Your physician recommends that you continue on your current medications as directed. Please refer to the Current Medication list given to you today.  If you need a refill on your cardiac medications before your next appointment, please call your pharmacy.    Follow-Up: At [facility], you and your health needs are our priority.  As part of our continuing mission to provide you with exceptional heart care, we have created designated Provider Care Teams.  These Care Teams include your primary Cardiologist (physician) and Advanced Practice Providers (APPs -  Physician Assistants and Nurse Practitioners) who all work together to provide you with the care you need, when you need it. You will need a follow up appointment in 12 months.  Please call our office 2 months in advance to schedule this appointment.  You may see Dr. Hilty or one of the following Advanced Practice Providers on your designated Care Team: Hao Meng, PA-C . Angela Duke, PA-C  Any Other Special Instructions Will Be Listed Below (If Applicable).    

## 2018-11-12 NOTE — Progress Notes (Signed)
Virtual Visit via Telephone Note   This visit type was conducted due to national recommendations for restrictions regarding the COVID-19 Pandemic (e.g. social distancing) in an effort to limit this patient's exposure and mitigate transmission in our community.  Due to his co-morbid illnesses, this patient is at least at moderate risk for complications without adequate follow up.  This format is felt to be most appropriate for this patient at this time.  The patient did not have access to video technology/had technical difficulties with video requiring transitioning to audio format only (telephone).  All issues noted in this document were discussed and addressed.  No physical exam could be performed with this format.  Please refer to the patient's chart for his  consent to telehealth for [facility].   Evaluation Performed:  Telephone follow-up  Date:  11/12/2018   ID:  Kevin, [name] 1955-05-10, MRN [id number]  Patient Location:  [place] [address]  Provider location:   [address] [place], [place] [address]  PCP:  Lemmie Evens, MD  Cardiologist:  No primary care provider on file. Electrophysiologist:  None   Chief Complaint:  No complaints  History of Present Illness:    Kevin [name] is a 63 y.o. male who presents via audio/video conferencing for a telehealth visit today.  Kevin [name] seen today for telephone follow-up.  He denies any chest pain or shortness of breath.  Given his history of asthma and allergic rhinitis, he is reluctant to go out during the [place] pandemic.  He has a history of dyslipidemia.  This is been managed with moderate intensity simvastatin.  Recent labs in November 2019 showed total cholesterol 150, HDL 49, triglycerides 50 and LDL 88, this is in line with previous lab studies.  His diet is fairly stable.  He works on a farm.  He is fairly active.  He walks fairly regularly.  Blood pressures been well controlled in the  381W to 299B systolic.  The patient does not have symptoms concerning for COVID-19 infection (fever, chills, cough, or new SHORTNESS OF BREATH).    Prior CV studies:   The following studies were reviewed today:  Chart reviewed Lab work  PMHx:  Past Medical History:  Diagnosis Date   ALLERGIC RHINITIS    Asthma    Dyslipidemia    Family history of heart disease    Hepatitis    3rd Grade   History of insomnia    History of nuclear stress test 08/2007    negative bruce myoview   History of palpitations     Past Surgical History:  Procedure Laterality Date   COLONOSCOPY N/A 07/12/2017   Procedure: COLONOSCOPY;  Surgeon: Daneil Dolin, MD;  [facility];  Service: Endoscopy;  Laterality: N/A;   NASAL SEPTOPLASTY W/ TURBINOPLASTY  2000   deviated septum   POLYPECTOMY  07/12/2017   Procedure: POLYPECTOMY;  Surgeon: Daneil Dolin, MD;  [facility];  Service: Endoscopy;;  recto-sigmoid colon   Sleep Study  07/11/2005   AHI during total sleep 3.68/hr, during REM sleep 5.52/hr   TONSILLECTOMY     TRANSTHORACIC ECHOCARDIOGRAM  09/13/2007   EF=>55%, borderline asymmetric LVH; mild MR/TR, normal RVSP; AV mildly sclerotic, trace AV regurg & trave pulm vavle regurg   varicocoel      FAMHx:  Family History  Problem Relation Age of Onset   Heart attack Father 70       also CHF &  lung problems   Heart attack Brother 55   Heart attack Brother 55   Heart attack Maternal Grandmother    Thyroid cancer Maternal Grandfather    Allergic rhinitis Neg Hx    Angioedema Neg Hx    Asthma Neg Hx    Eczema Neg Hx    Immunodeficiency Neg Hx    Colon cancer Neg Hx     SOCHx:   reports that he has never smoked. He has never used smokeless tobacco. He reports that he does not drink alcohol or use drugs.  ALLERGIES:  No Known Allergies  MEDS:  Current Meds  Medication Sig   albuterol (PROAIR HFA) 108 (90 Base) MCG/ACT inhaler USE 2  INHALATIONS ORALLY 4 TIMES DAILY AS NEEDED   budesonide (RHINOCORT ALLERGY) 32 MCG/ACT nasal spray Place 1 spray into both nostrils daily as needed for allergies.    budesonide-formoterol (SYMBICORT) 160-4.5 MCG/ACT inhaler Inhale 2 puffs into the lungs 2 (two) times daily.   cromolyn (NASALCROM) 5.2 MG/ACT nasal spray Place 1 spray into both nostrils daily as needed for allergies.    fexofenadine (ALLEGRA) 180 MG tablet Take 180 mg by mouth daily as needed for allergies.    guaiFENesin (MUCINEX) 600 MG 12 hr tablet Take 600 mg by mouth 2 (two) times daily.   Methylcellulose, Laxative, 500 MG TABS Take 2 tablets by mouth daily.   montelukast (SINGULAIR) 10 MG tablet TAKE 1 TABLET BY MOUTH AT BEDTIME   omeprazole (PRILOSEC) 20 MG capsule TAKE 1 CAPSULE BY MOUTH DAILY   PRESCRIPTION MEDICATION ALLERGY VACCINE -- Use as directed weekly.   ranitidine (ZANTAC) 300 MG tablet TAKE 1 TABLET BY MOUTH AT BEDTIME   SAW PALMETTO, SERENOA REPENS, PO Take 2 capsules by mouth daily.   simvastatin (ZOCOR) 20 MG tablet Take 1 tablet (20 mg total) by mouth at bedtime.     ROS: Pertinent items noted in HPI and remainder of comprehensive ROS otherwise negative.  Labs/Other Tests and Data Reviewed:    Recent Labs: No results found for requested labs within last 8760 hours.   Recent Lipid Panel No results found for: CHOL, TRIG, HDL, CHOLHDL, LDLCALC, LDLDIRECT  Wt Readings from Last 3 Encounters:  11/12/18 170 lb (77.1 kg)  06/28/18 170 lb (77.1 kg)  04/05/18 171 lb (77.6 kg)     Exam:    Vital Signs:  BP 131/84    Pulse 76    Ht 5\' 9"  (1.753 m)    Wt 170 lb (77.1 kg)    BMI 25.10 kg/m    Exam not performed due to telephone visit  ASSESSMENT & PLAN:    1. Mixed dyslipidemia 2. Family history of premature coronary disease 3. OSA on CPAP  Overall Kevin [name] is doing well.  He has good control over his dyslipidemia.  He does have a family history of premature coronary disease so  aggressive therapy is recommended however he is still at fairly low risk.  I would target his LDL less than 100.  He does have sleep apnea which is being managed as well as seasonal allergies and asthma.  COVID-19 Education: The signs and symptoms of COVID-19 were discussed with the patient and how to seek care for testing (follow up with PCP or arrange E-visit).  The importance of social distancing was discussed today.  Patient Risk:   After full review of this patients clinical status, I feel that they are at least moderate risk at this time.  Time:   Today,  I have spent 25 minutes with the patient with telehealth technology discussing dyslipidemia, physical activity, COVID-19 precautions   Medication Adjustments/Labs and Tests Ordered: Current medicines are reviewed at length with the patient today.  Concerns regarding medicines are outlined above.   Tests Ordered: No orders of the defined types were placed in this encounter.   Medication Changes: No orders of the defined types were placed in this encounter.   Disposition:  in 1 year(s)  Kevin NoseKenneth C. Dlisa Barnwell, MD, [facility] Endoscopy CenterFACC, FACP  [place]   [facility]  Medical Director of the Advanced Lipid Disorders &  Cardiovascular Risk Reduction Clinic Diplomate of the American Board of Clinical Lipidology Attending Cardiologist  Direct Dial: [phone]   Fax: [phone]  Website:  [email or web address]  Kevin NoseKenneth C Domique Reardon, MD  11/12/2018 8:16 AM

## 2018-11-14 ENCOUNTER — Ambulatory Visit (INDEPENDENT_AMBULATORY_CARE_PROVIDER_SITE_OTHER): Payer: Federal, State, Local not specified - PPO

## 2018-11-14 ENCOUNTER — Other Ambulatory Visit: Payer: Self-pay

## 2018-11-14 DIAGNOSIS — J309 Allergic rhinitis, unspecified: Secondary | ICD-10-CM

## 2018-11-15 ENCOUNTER — Other Ambulatory Visit: Payer: Self-pay | Admitting: Allergy and Immunology

## 2018-11-20 ENCOUNTER — Encounter: Payer: Self-pay | Admitting: Pulmonary Disease

## 2018-11-20 ENCOUNTER — Other Ambulatory Visit: Payer: Self-pay

## 2018-11-20 ENCOUNTER — Ambulatory Visit: Payer: Federal, State, Local not specified - PPO | Admitting: Pulmonary Disease

## 2018-11-20 VITALS — BP 122/62 | HR 89 | Temp 98.4°F | Ht 69.0 in | Wt 172.4 lb

## 2018-11-20 DIAGNOSIS — Z9989 Dependence on other enabling machines and devices: Secondary | ICD-10-CM | POA: Diagnosis not present

## 2018-11-20 DIAGNOSIS — G4733 Obstructive sleep apnea (adult) (pediatric): Secondary | ICD-10-CM

## 2018-11-20 NOTE — Patient Instructions (Signed)
Obstructive sleep apnea-adequately treated  Good compliance  I will see you back in the office in about 1 year Call with significant concerns

## 2018-11-20 NOTE — Progress Notes (Signed)
ELIMELECH HOUSEMAN    [id number]    03/22/1956  Primary Care Physician:Knowlton, Richardson Landry, MD  Referring Physician: Lemmie Evens, MD [place],  [place] [address]  Chief complaint:   History of obstructive sleep apnea adequately treated with CPAP therapy Feeling better with CPAP changes  HPI:  Diagnosed with obstructive sleep apnea which is been using CPAP therapy Has a history of allergies CPAP currently at 10-tolerating it better, sleeping better  Mask issues are better  Functioning well, sleeping well at night  Occupation: No pertinent history Exposures: Exposure Smoking history: Smoker Travel history: No recent travel Relevant family history:  Outpatient Encounter Medications as of 11/20/2018  Medication Sig  . albuterol (PROAIR HFA) 108 (90 Base) MCG/ACT inhaler USE 2 INHALATIONS ORALLY 4 TIMES DAILY AS NEEDED  . budesonide (RHINOCORT ALLERGY) 32 MCG/ACT nasal spray Place 1 spray into both nostrils daily as needed for allergies.   . budesonide-formoterol (SYMBICORT) 160-4.5 MCG/ACT inhaler Inhale 2 puffs into the lungs 2 (two) times daily.  . cromolyn (NASALCROM) 5.2 MG/ACT nasal spray Place 1 spray into both nostrils daily as needed for allergies.   . fexofenadine (ALLEGRA) 180 MG tablet Take 180 mg by mouth daily as needed for allergies.   Marland Kitchen guaiFENesin (MUCINEX) 600 MG 12 hr tablet Take 600 mg by mouth 2 (two) times daily.  . Methylcellulose, Laxative, 500 MG TABS Take 2 tablets by mouth daily.  . montelukast (SINGULAIR) 10 MG tablet TAKE 1 TABLET BY MOUTH AT BEDTIME  . omeprazole (PRILOSEC) 20 MG capsule TAKE 1 CAPSULE BY MOUTH DAILY  . PRESCRIPTION MEDICATION ALLERGY VACCINE -- Use as directed weekly.  . simvastatin (ZOCOR) 20 MG tablet Take 1 tablet (20 mg total) by mouth at bedtime.  . [DISCONTINUED] ranitidine (ZANTAC) 300 MG tablet TAKE 1 TABLET BY MOUTH AT BEDTIME  . [DISCONTINUED] SAW PALMETTO, SERENOA REPENS, PO Take 2 capsules by mouth  daily.   No facility-administered encounter medications on file as of 11/20/2018.     Allergies as of 11/20/2018  . (No Known Allergies)    Past Medical History:  Diagnosis Date  . ALLERGIC RHINITIS   . Asthma   . Dyslipidemia   . Family history of heart disease   . Hepatitis    3rd Grade  . History of insomnia   . History of nuclear stress test 08/2007    negative bruce myoview  . History of palpitations     Past Surgical History:  Procedure Laterality Date  . COLONOSCOPY N/A 07/12/2017   Procedure: COLONOSCOPY;  Surgeon: Daneil Dolin, MD;  [facility];  Service: Endoscopy;  Laterality: N/A;  . NASAL SEPTOPLASTY W/ TURBINOPLASTY  2000   deviated septum  . POLYPECTOMY  07/12/2017   Procedure: POLYPECTOMY;  Surgeon: Daneil Dolin, MD;  [facility];  Service: Endoscopy;;  recto-sigmoid colon  . Sleep Study  07/11/2005   AHI during total sleep 3.68/hr, during REM sleep 5.52/hr  . TONSILLECTOMY    . TRANSTHORACIC ECHOCARDIOGRAM  09/13/2007   EF=>55%, borderline asymmetric LVH; mild MR/TR, normal RVSP; AV mildly sclerotic, trace AV regurg & trave pulm vavle regurg  . varicocoel      Family History  Problem Relation Age of Onset  . Heart attack Father 36       also CHF & lung problems  . Heart attack Brother 64  . Heart attack Brother 22  . Heart attack Maternal Grandmother   .  Thyroid cancer Maternal Grandfather   . Allergic rhinitis Neg Hx   . Angioedema Neg Hx   . Asthma Neg Hx   . Eczema Neg Hx   . Immunodeficiency Neg Hx   . Colon cancer Neg Hx     Social History   Socioeconomic History  . Marital status: Married    Spouse name: Not on file  . Number of children: 2  . Years of education: Not on file  . Highest education level: Not on file  Occupational History  . 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 Financial resource strain: Not on file  . Food insecurity    Worry: Not on file    Inability: Not on file  .  Transportation needs    Medical: Not on file    Non-medical: Not on file  Tobacco Use  . Smoking status: Never Smoker  . Smokeless tobacco: Never Used  Substance and Sexual Activity  . Alcohol use: No  . Drug use: No  . Sexual activity: Not on file  Lifestyle  . Physical activity    Days per week: Not on file    Minutes per session: Not on file  . Stress: Not on file  Relationships  . Social Musicianconnections    Talks on phone: Not on file    Gets together: Not on file    Attends religious service: Not on file    Active member of club or organization: Not on file    Attends meetings of clubs or organizations: Not on file    Relationship status: Not on file  . Intimate partner violence    Fear of current or ex partner: Not on file    Emotionally abused: Not on file    Physically abused: Not on file    Forced sexual activity: Not on file  Other Topics Concern  . Not on file  Social History Narrative  . Not on file    Review of Systems  Constitutional: Negative.   Respiratory: Positive for apnea. Negative for cough.   Cardiovascular: Negative.   Gastrointestinal: Negative.   Endocrine: Negative.   Psychiatric/Behavioral: Positive for sleep disturbance.  All other systems reviewed and are negative.   Vitals:   11/20/18 1456  BP: 122/62  Pulse: 89  Temp: 98.4 F (36.9 C)  SpO2: 96%      Physical Exam  Constitutional: He is oriented to person, place, and time. He appears well-developed and well-nourished.  Neck: Normal range of motion. Neck supple. No thyromegaly present.  Cardiovascular: Normal rate and regular rhythm.  Pulmonary/Chest: Effort normal and breath sounds normal. No respiratory distress. He has no wheezes. He has no rales.  Neurological: He is alert and oriented to person, place, and time.   Data Reviewed: CPAP download reviewed showing 100% compliance He is on CPAP of 10 AHI of 2.8 Occasional leaks  Assessment:   Obstructive sleep apnea  -Adequately treated with CPAP therapy -Pressures better tolerated  History of allergies Seasonal allergies Rhinitis -Better controlled   Plan/Recommendations:  Continue CPAP of 10  Call with significant concerns  We will see back in the office in 1 year or sooner if any concerns   Virl DiamondAdewale Baylin Cabal MD [facility] 11/20/2018, 3:16 PM  CC: Gareth MorganKnowlton, Steve, MD

## 2018-11-28 ENCOUNTER — Ambulatory Visit (INDEPENDENT_AMBULATORY_CARE_PROVIDER_SITE_OTHER): Payer: Federal, State, Local not specified - PPO

## 2018-11-28 ENCOUNTER — Other Ambulatory Visit: Payer: Self-pay

## 2018-11-28 DIAGNOSIS — J309 Allergic rhinitis, unspecified: Secondary | ICD-10-CM

## 2018-12-06 DIAGNOSIS — J3089 Other allergic rhinitis: Secondary | ICD-10-CM | POA: Diagnosis not present

## 2018-12-06 NOTE — Progress Notes (Signed)
VIALS EXP 12-06-2019 

## 2018-12-12 ENCOUNTER — Ambulatory Visit (INDEPENDENT_AMBULATORY_CARE_PROVIDER_SITE_OTHER): Payer: Federal, State, Local not specified - PPO

## 2018-12-12 DIAGNOSIS — J309 Allergic rhinitis, unspecified: Secondary | ICD-10-CM | POA: Diagnosis not present

## 2018-12-26 ENCOUNTER — Ambulatory Visit (INDEPENDENT_AMBULATORY_CARE_PROVIDER_SITE_OTHER): Payer: Federal, State, Local not specified - PPO | Admitting: *Deleted

## 2018-12-26 DIAGNOSIS — J309 Allergic rhinitis, unspecified: Secondary | ICD-10-CM

## 2019-01-03 ENCOUNTER — Other Ambulatory Visit: Payer: Self-pay | Admitting: Allergy and Immunology

## 2019-01-09 ENCOUNTER — Ambulatory Visit (INDEPENDENT_AMBULATORY_CARE_PROVIDER_SITE_OTHER): Payer: Federal, State, Local not specified - PPO | Admitting: *Deleted

## 2019-01-09 DIAGNOSIS — J309 Allergic rhinitis, unspecified: Secondary | ICD-10-CM | POA: Diagnosis not present

## 2019-01-16 ENCOUNTER — Ambulatory Visit (INDEPENDENT_AMBULATORY_CARE_PROVIDER_SITE_OTHER): Payer: Federal, State, Local not specified - PPO

## 2019-01-16 DIAGNOSIS — J309 Allergic rhinitis, unspecified: Secondary | ICD-10-CM | POA: Diagnosis not present

## 2019-01-23 ENCOUNTER — Ambulatory Visit (INDEPENDENT_AMBULATORY_CARE_PROVIDER_SITE_OTHER): Payer: Federal, State, Local not specified - PPO

## 2019-01-23 DIAGNOSIS — J309 Allergic rhinitis, unspecified: Secondary | ICD-10-CM

## 2019-01-30 ENCOUNTER — Ambulatory Visit (INDEPENDENT_AMBULATORY_CARE_PROVIDER_SITE_OTHER): Payer: Federal, State, Local not specified - PPO

## 2019-01-30 DIAGNOSIS — J309 Allergic rhinitis, unspecified: Secondary | ICD-10-CM

## 2019-02-01 ENCOUNTER — Other Ambulatory Visit: Payer: Self-pay | Admitting: Allergy and Immunology

## 2019-02-06 ENCOUNTER — Ambulatory Visit (INDEPENDENT_AMBULATORY_CARE_PROVIDER_SITE_OTHER): Payer: Federal, State, Local not specified - PPO

## 2019-02-06 DIAGNOSIS — J309 Allergic rhinitis, unspecified: Secondary | ICD-10-CM

## 2019-02-19 ENCOUNTER — Other Ambulatory Visit: Payer: Self-pay

## 2019-02-19 ENCOUNTER — Encounter: Payer: Self-pay | Admitting: Allergy and Immunology

## 2019-02-19 ENCOUNTER — Ambulatory Visit: Payer: Federal, State, Local not specified - PPO | Admitting: Allergy and Immunology

## 2019-02-19 VITALS — BP 118/80 | HR 81 | Temp 98.3°F | Resp 16 | Ht 69.0 in | Wt 173.2 lb

## 2019-02-19 DIAGNOSIS — R0609 Other forms of dyspnea: Secondary | ICD-10-CM

## 2019-02-19 DIAGNOSIS — J3089 Other allergic rhinitis: Secondary | ICD-10-CM | POA: Diagnosis not present

## 2019-02-19 DIAGNOSIS — K219 Gastro-esophageal reflux disease without esophagitis: Secondary | ICD-10-CM | POA: Diagnosis not present

## 2019-02-19 DIAGNOSIS — G4733 Obstructive sleep apnea (adult) (pediatric): Secondary | ICD-10-CM | POA: Diagnosis not present

## 2019-02-19 DIAGNOSIS — J4540 Moderate persistent asthma, uncomplicated: Secondary | ICD-10-CM

## 2019-02-19 DIAGNOSIS — R0600 Dyspnea, unspecified: Secondary | ICD-10-CM

## 2019-02-19 NOTE — Patient Instructions (Addendum)
  1. Continue immunotherapy  2. Continue to Treat and prevent inflammation:   A. Symbicort 160 - 2 inhalations 1-2 times per day with spacer.  B. OTC Rhinocort - 1 spray each nostril once 3-7 times per week  C. montelukast 10 mg one tablet once a day  3. Continue to Treat and prevent reflux:   A. minimize caffeine consumption.   B. OTC omeprazole 20 mg 1 tablet in a.m.  C. famotidine 40 mg in PM if needed  4.  Continue CPAP for treatment of sleep apnea  5. If needed:   A. ProAir HFA 2 puffs every 4-6 hours  B. nasal saline spray  C. OTC antihistamine  6.  Discussed with cardiologist about performing a exercise treadmill test  7.  Obtain fall flu vaccine (and Covid vaccine)  8.  Return to clinic in 6 months or earlier if problem

## 2019-02-19 NOTE — Progress Notes (Signed)
[place] [facility] [place] [facility] [place] - [place] [facility] [place]   Follow-up Note  Referring Provider: Gareth Morgan, MD Primary Provider: Gareth Morgan, MD Date of Office Visit: 02/19/2019  Subjective:   Kevin [name] (DOB: 26-Oct-1955) is a 63 y.o. male who [facility] in re-evaluation of the following:  HPI: Kevin [name] returns to this clinic in reevaluation of asthma and allergic rhinitis and LPR and sleep apnea.  His last contact with this clinic was 07 August 2018 via an E med visit.  Overall he has done very well with his asthma.  He rarely uses a short acting bronchodilator and has not required a systemic steroid to treat an exacerbation while he remains on Symbicort.  He has not had any significant problems with his nose and has not required an antibiotic to treat an episode of sinusitis.  He continues to use Rhinocort and montelukast.  His immunotherapy is going quite well currently at every week without any adverse effect.  His reflux is under very good control at this point time while minimizing caffeine consumption and using omeprazole 1 time per day and occasionally and rarely famotidine.  But he has noticed that when he walks up hills he does get very short of breath.  There is no associated chest pain.  He does have a strong family history of heart disease in his brothers and he is followed by a cardiologist for this issue.  Allergies as of 02/19/2019   No Known Allergies     Medication List    albuterol 108 (90 Base) MCG/ACT inhaler Commonly known as: ProAir HFA USE 2 INHALATIONS ORALLY 4 TIMES DAILY AS NEEDED   budesonide-formoterol 160-4.5 MCG/ACT inhaler Commonly known as: SYMBICORT USE 2 INHALATIONS ORALLY   INTO THE LUNGS TWO TIMES A DAY   fexofenadine 180 MG tablet Commonly known as: ALLEGRA Take 180 mg by mouth daily as needed for allergies.   Methylcellulose (Laxative) 500 MG Tabs Take 2 tablets by mouth daily.    montelukast 10 MG tablet Commonly known as: SINGULAIR TAKE 1 TABLET BY MOUTH AT BEDTIME   NasalCrom 5.2 MG/ACT nasal spray Generic drug: cromolyn Place 1 spray into both nostrils daily as needed for allergies.   omeprazole 20 MG capsule Commonly known as: PRILOSEC TAKE 1 CAPSULE BY MOUTH DAILY   PRESCRIPTION MEDICATION ALLERGY VACCINE -- Use as directed weekly.   Rhinocort Allergy 32 MCG/ACT nasal spray Generic drug: budesonide Place 1 spray into both nostrils daily as needed for allergies.   simvastatin 20 MG tablet Commonly known as: ZOCOR Take 1 tablet (20 mg total) by mouth at bedtime.       Past Medical History:  Diagnosis Date  . ALLERGIC RHINITIS   . Asthma   . Dyslipidemia   . Family history of heart disease   . Hepatitis    3rd Grade  . History of insomnia   . History of nuclear stress test 08/2007    negative bruce myoview  . History of palpitations     Past Surgical History:  Procedure Laterality Date  . COLONOSCOPY N/A 07/12/2017   Procedure: COLONOSCOPY;  Surgeon: Corbin Ade, MD;  [facility];  Service: Endoscopy;  Laterality: N/A;  . NASAL SEPTOPLASTY W/ TURBINOPLASTY  2000   deviated septum  . POLYPECTOMY  07/12/2017   Procedure: POLYPECTOMY;  Surgeon: Corbin Ade, MD;  Location: [facility];  Service: Endoscopy;;  recto-sigmoid colon  . Sleep  Study  07/11/2005   AHI during total sleep 3.68/hr, during REM sleep 5.52/hr  . TONSILLECTOMY    . TRANSTHORACIC ECHOCARDIOGRAM  09/13/2007   EF=>55%, borderline asymmetric LVH; mild MR/TR, normal RVSP; AV mildly sclerotic, trace AV regurg & trave pulm vavle regurg  . varicocoel      Review of systems negative except as noted in HPI / PMHx or noted below:  Review of Systems  Constitutional: Negative.   HENT: Negative.   Eyes: Negative.   Respiratory: Negative.   Cardiovascular: Negative.   Gastrointestinal: Negative.   Genitourinary: Negative.   Musculoskeletal: Negative.    Skin: Negative.   Neurological: Negative.   Endo/Heme/Allergies: Negative.   Psychiatric/Behavioral: Negative.      Objective:   Vitals:   02/19/19 1629  BP: 118/80  Pulse: 81  Resp: 16  Temp: 98.3 F (36.8 C)  SpO2: 97%   Height: 5\' 9"  (175.3 cm)  Weight: 173 lb 3.2 oz (78.6 kg)   Physical Exam Constitutional:      Appearance: He is not diaphoretic.  HENT:     Head: Normocephalic.     Right Ear: External ear normal.     Left Ear: External ear normal.     Ears:     Comments: Bilateral hearing aids    Nose: Nose normal. No mucosal edema or rhinorrhea.     Mouth/Throat:     Pharynx: Uvula midline. No oropharyngeal exudate.  Eyes:     Conjunctiva/sclera: Conjunctivae normal.  Neck:     Thyroid: No thyromegaly.     Trachea: Trachea normal. No tracheal tenderness or tracheal deviation.  Cardiovascular:     Rate and Rhythm: Normal rate and regular rhythm.     Heart sounds: Normal heart sounds, S1 normal and S2 normal. No murmur.  Pulmonary:     Effort: No respiratory distress.     Breath sounds: Normal breath sounds. No stridor. No wheezing or rales.  Lymphadenopathy:     Head:     Right side of head: No tonsillar adenopathy.     Left side of head: No tonsillar adenopathy.     Cervical: No cervical adenopathy.  Skin:    Findings: No erythema or rash.     Nails: There is no clubbing.   Neurological:     Mental Status: He is alert.     Diagnostics:    Spirometry was performed and demonstrated an FEV1 of 4.35 at 132 % of predicted.  Assessment and Plan:   1. Asthma, moderate persistent, well-controlled   2. Perennial allergic rhinitis   3. LPRD (laryngopharyngeal reflux disease)   4. Obstructive sleep apnea syndrome   5. Dyspnea on exertion     1. Continue immunotherapy  2. Continue to Treat and prevent inflammation:   A. Symbicort 160 - 2 inhalations 1-2 times per day with spacer.  B. OTC Rhinocort - 1 spray each nostril once 3-7 times per week   C. montelukast 10 mg one tablet once a day  3. Continue to Treat and prevent reflux:   A. minimize caffeine consumption.   B. OTC omeprazole 20 mg 1 tablet in a.m.  C. famotidine 40 mg in PM if needed  4.  Continue CPAP for treatment of sleep apnea  5. If needed:   A. ProAir HFA 2 puffs every 4-6 hours  B. nasal saline spray  C. OTC antihistamine  6.  Discussed with cardiologist about performing a exercise treadmill test  7.  Obtain fall flu vaccine (and  Covid vaccine)  8.  Return to clinic in 6 months or earlier if problem  Overall Kevin [name] is really doing relatively well on his current plan of action which includes anti-inflammatory agents for his airway and therapy directed against reflux.  I encouraged him to follow-up with his cardiologist this November about possibly getting an exercise treadmill test especially given the fact that he has significant dyspnea on exertion and he does have a strong family history of heart disease and his spirometry basically identifies normal lung function today.  I will see him back in his clinic in 6 months or earlier if there is a problem.  Allena Katz, MD Allergy / Immunology [place]

## 2019-02-20 ENCOUNTER — Encounter: Payer: Self-pay | Admitting: Allergy and Immunology

## 2019-02-20 ENCOUNTER — Ambulatory Visit (INDEPENDENT_AMBULATORY_CARE_PROVIDER_SITE_OTHER): Payer: Federal, State, Local not specified - PPO | Admitting: *Deleted

## 2019-02-20 DIAGNOSIS — J309 Allergic rhinitis, unspecified: Secondary | ICD-10-CM | POA: Diagnosis not present

## 2019-03-03 ENCOUNTER — Other Ambulatory Visit: Payer: Self-pay | Admitting: Allergy and Immunology

## 2019-03-06 ENCOUNTER — Ambulatory Visit (INDEPENDENT_AMBULATORY_CARE_PROVIDER_SITE_OTHER): Payer: Federal, State, Local not specified - PPO | Admitting: *Deleted

## 2019-03-06 DIAGNOSIS — J309 Allergic rhinitis, unspecified: Secondary | ICD-10-CM

## 2019-03-11 DIAGNOSIS — J301 Allergic rhinitis due to pollen: Secondary | ICD-10-CM | POA: Diagnosis not present

## 2019-03-11 NOTE — Progress Notes (Signed)
VIALS EXP 03-10-20 

## 2019-03-20 ENCOUNTER — Ambulatory Visit (INDEPENDENT_AMBULATORY_CARE_PROVIDER_SITE_OTHER): Payer: Federal, State, Local not specified - PPO

## 2019-03-20 DIAGNOSIS — J309 Allergic rhinitis, unspecified: Secondary | ICD-10-CM | POA: Diagnosis not present

## 2019-04-03 ENCOUNTER — Ambulatory Visit (INDEPENDENT_AMBULATORY_CARE_PROVIDER_SITE_OTHER): Payer: Federal, State, Local not specified - PPO | Admitting: *Deleted

## 2019-04-03 DIAGNOSIS — J309 Allergic rhinitis, unspecified: Secondary | ICD-10-CM | POA: Diagnosis not present

## 2019-04-17 ENCOUNTER — Ambulatory Visit (INDEPENDENT_AMBULATORY_CARE_PROVIDER_SITE_OTHER): Payer: Federal, State, Local not specified - PPO

## 2019-04-17 DIAGNOSIS — J309 Allergic rhinitis, unspecified: Secondary | ICD-10-CM | POA: Diagnosis not present

## 2019-05-01 ENCOUNTER — Ambulatory Visit (INDEPENDENT_AMBULATORY_CARE_PROVIDER_SITE_OTHER): Payer: Federal, State, Local not specified - PPO

## 2019-05-01 DIAGNOSIS — J309 Allergic rhinitis, unspecified: Secondary | ICD-10-CM

## 2019-05-15 ENCOUNTER — Ambulatory Visit (INDEPENDENT_AMBULATORY_CARE_PROVIDER_SITE_OTHER): Payer: Federal, State, Local not specified - PPO

## 2019-05-15 DIAGNOSIS — J309 Allergic rhinitis, unspecified: Secondary | ICD-10-CM | POA: Diagnosis not present

## 2019-06-05 ENCOUNTER — Ambulatory Visit (INDEPENDENT_AMBULATORY_CARE_PROVIDER_SITE_OTHER): Payer: Federal, State, Local not specified - PPO

## 2019-06-05 DIAGNOSIS — J309 Allergic rhinitis, unspecified: Secondary | ICD-10-CM | POA: Diagnosis not present

## 2019-06-12 ENCOUNTER — Ambulatory Visit (INDEPENDENT_AMBULATORY_CARE_PROVIDER_SITE_OTHER): Payer: Federal, State, Local not specified - PPO

## 2019-06-12 DIAGNOSIS — J309 Allergic rhinitis, unspecified: Secondary | ICD-10-CM

## 2019-06-15 ENCOUNTER — Other Ambulatory Visit: Payer: Self-pay | Admitting: Allergy and Immunology

## 2019-06-19 ENCOUNTER — Ambulatory Visit (INDEPENDENT_AMBULATORY_CARE_PROVIDER_SITE_OTHER): Payer: Federal, State, Local not specified - PPO

## 2019-06-19 DIAGNOSIS — J309 Allergic rhinitis, unspecified: Secondary | ICD-10-CM | POA: Diagnosis not present

## 2019-07-03 ENCOUNTER — Ambulatory Visit (INDEPENDENT_AMBULATORY_CARE_PROVIDER_SITE_OTHER): Payer: Federal, State, Local not specified - PPO

## 2019-07-03 DIAGNOSIS — J309 Allergic rhinitis, unspecified: Secondary | ICD-10-CM

## 2019-07-10 ENCOUNTER — Ambulatory Visit (INDEPENDENT_AMBULATORY_CARE_PROVIDER_SITE_OTHER): Payer: Federal, State, Local not specified - PPO

## 2019-07-10 DIAGNOSIS — J309 Allergic rhinitis, unspecified: Secondary | ICD-10-CM

## 2019-07-17 ENCOUNTER — Ambulatory Visit (INDEPENDENT_AMBULATORY_CARE_PROVIDER_SITE_OTHER): Payer: Federal, State, Local not specified - PPO

## 2019-07-17 DIAGNOSIS — J309 Allergic rhinitis, unspecified: Secondary | ICD-10-CM

## 2019-07-31 ENCOUNTER — Ambulatory Visit (INDEPENDENT_AMBULATORY_CARE_PROVIDER_SITE_OTHER): Payer: Federal, State, Local not specified - PPO

## 2019-07-31 DIAGNOSIS — J309 Allergic rhinitis, unspecified: Secondary | ICD-10-CM | POA: Diagnosis not present

## 2019-08-12 DIAGNOSIS — J3089 Other allergic rhinitis: Secondary | ICD-10-CM | POA: Diagnosis not present

## 2019-08-12 NOTE — Progress Notes (Signed)
EXP 08/11/20 

## 2019-08-14 ENCOUNTER — Ambulatory Visit (INDEPENDENT_AMBULATORY_CARE_PROVIDER_SITE_OTHER): Payer: Federal, State, Local not specified - PPO

## 2019-08-14 DIAGNOSIS — J309 Allergic rhinitis, unspecified: Secondary | ICD-10-CM

## 2019-08-20 ENCOUNTER — Ambulatory Visit: Payer: Federal, State, Local not specified - PPO | Admitting: Allergy and Immunology

## 2019-08-20 ENCOUNTER — Encounter: Payer: Self-pay | Admitting: Allergy and Immunology

## 2019-08-20 ENCOUNTER — Other Ambulatory Visit: Payer: Self-pay

## 2019-08-20 VITALS — BP 130/80 | HR 93 | Temp 98.2°F | Resp 16

## 2019-08-20 DIAGNOSIS — J4540 Moderate persistent asthma, uncomplicated: Secondary | ICD-10-CM

## 2019-08-20 DIAGNOSIS — G4733 Obstructive sleep apnea (adult) (pediatric): Secondary | ICD-10-CM | POA: Diagnosis not present

## 2019-08-20 DIAGNOSIS — J3089 Other allergic rhinitis: Secondary | ICD-10-CM | POA: Diagnosis not present

## 2019-08-20 DIAGNOSIS — K219 Gastro-esophageal reflux disease without esophagitis: Secondary | ICD-10-CM

## 2019-08-20 MED ORDER — BUDESONIDE-FORMOTEROL FUMARATE 160-4.5 MCG/ACT IN AERO: INHALATION_SPRAY | RESPIRATORY_TRACT | 1 refills | Status: DC

## 2019-08-20 MED ORDER — FAMOTIDINE 40 MG PO TABS
40.0000 mg | ORAL_TABLET | Freq: Every day | ORAL | 1 refills | Status: DC
Start: 2019-08-20 — End: 2020-05-04

## 2019-08-20 MED ORDER — OMEPRAZOLE 20 MG PO CPDR: 20.0000 mg | DELAYED_RELEASE_CAPSULE | Freq: Every day | ORAL | 1 refills | Status: DC

## 2019-08-20 NOTE — Progress Notes (Signed)
Kevin [name]   Follow-up Note  Referring Provider: Lemmie Evens, MD Primary Provider: Lemmie Evens, MD Date of Office Visit: 08/20/2019  Subjective:   Kevin [name] (DOB: 04/23/1956) is a 65 y.o. male who returns to the [facility] [place] on 08/20/2019 in re-evaluation of the following:  HPI: Kevin [name] returns to this clinic in reevaluation of asthma and allergic rhinitis and LPR and sleep apnea.  His last visit to this clinic was 19 February 2019.  He is undergoing a course of immunotherapy currently at every 2-week administration.  This has resulted in significant improvement and he has been able to go through the spring with very little problem.  He has not had a flare of his asthma and has not had to use a short acting bronchodilator.  He has been consistently using his Symbicort.  He continues on Rhinocort and montelukast on a regular basis.  He had a little bit more stuffiness with his nose this spring but not particularly bad.  Sometimes he will use an Afrin at nighttime.  His reflux is under excellent control at this point.  When I last saw him in this clinic he was having some dyspnea on exertion.  Fortunately, that issue had completely resolved.  Sleep apnea is going quite well with his CPAP use.  He has had 2 Moderna Covid vaccinations.  Allergies as of 08/20/2019   No Known Allergies     Medication List    albuterol 108 (90 Base) MCG/ACT inhaler Commonly known as: ProAir HFA USE 2 INHALATIONS ORALLY 4 TIMES DAILY AS NEEDED   budesonide-formoterol 160-4.5 MCG/ACT inhaler Commonly known as: SYMBICORT USE 2 INHALATIONS ORALLY   INTO THE LUNGS TWO TIMES A DAY   fexofenadine 180 MG tablet Commonly known as: ALLEGRA Take 180 mg by mouth daily as needed for allergies.   Methylcellulose (Laxative) 500 MG Tabs Take 2 tablets by mouth daily.   montelukast 10 MG tablet Commonly known as: SINGULAIR TAKE 1 TABLET BY  MOUTH AT BEDTIME   omeprazole 20 MG capsule Commonly known as: PRILOSEC TAKE 1 CAPSULE BY MOUTH DAILY   PRESCRIPTION MEDICATION ALLERGY VACCINE -- Use as directed weekly.   Rhinocort Allergy 32 MCG/ACT nasal spray Generic drug: budesonide Place 1 spray into both nostrils daily as needed for allergies.   simvastatin 20 MG tablet Commonly known as: ZOCOR Take 1 tablet (20 mg total) by mouth at bedtime.       Past Medical History:  Diagnosis Date  . ALLERGIC RHINITIS   . Asthma   . Dyslipidemia   . Family history of heart disease   . Hepatitis    3rd Grade  . History of insomnia   . History of nuclear stress test 08/2007    negative bruce myoview  . History of palpitations     Past Surgical History:  Procedure Laterality Date  . COLONOSCOPY N/A 07/12/2017   Procedure: COLONOSCOPY;  Surgeon: Daneil Dolin, MD;  [facility];  Service: Endoscopy;  Laterality: N/A;  . NASAL SEPTOPLASTY W/ TURBINOPLASTY  2000   deviated septum  . POLYPECTOMY  07/12/2017   Procedure: POLYPECTOMY;  Surgeon: Daneil Dolin, MD;  [facility];  Service: Endoscopy;;  recto-sigmoid colon  . Sleep Study  07/11/2005   AHI during total sleep 3.68/hr, during REM sleep 5.52/hr  . TONSILLECTOMY    . TRANSTHORACIC ECHOCARDIOGRAM  09/13/2007   EF=>55%, borderline asymmetric LVH; mild MR/TR,  normal RVSP; AV mildly sclerotic, trace AV regurg & trave pulm vavle regurg  . varicocoel      Review of systems negative except as noted in HPI / PMHx or noted below:  Review of Systems  Constitutional: Negative.   HENT: Negative.   Eyes: Negative.   Respiratory: Negative.   Cardiovascular: Negative.   Gastrointestinal: Negative.   Genitourinary: Negative.   Musculoskeletal: Negative.   Skin: Negative.   Neurological: Negative.   Endo/Heme/Allergies: Negative.   Psychiatric/Behavioral: Negative.      Objective:   Vitals:   08/20/19 1543  BP: 130/80  Pulse: 93  Resp: 16    Temp: 98.2 F (36.8 C)  SpO2: 96%          Physical Exam Constitutional:      Appearance: He is not diaphoretic.  HENT:     Head: Normocephalic.     Right Ear: Tympanic membrane, ear canal and external ear normal.     Left Ear: Tympanic membrane, ear canal and external ear normal.     Nose: Nose normal. No mucosal edema or rhinorrhea.     Mouth/Throat:     Pharynx: Uvula midline. No oropharyngeal exudate.  Eyes:     Conjunctiva/sclera: Conjunctivae normal.  Neck:     Thyroid: No thyromegaly.     Trachea: Trachea normal. No tracheal tenderness or tracheal deviation.  Cardiovascular:     Rate and Rhythm: Normal rate and regular rhythm.     Heart sounds: Normal heart sounds, S1 normal and S2 normal. No murmur.  Pulmonary:     Effort: No respiratory distress.     Breath sounds: Normal breath sounds. No stridor. No wheezing or rales.  Lymphadenopathy:     Head:     Right side of head: No tonsillar adenopathy.     Left side of head: No tonsillar adenopathy.     Cervical: No cervical adenopathy.  Skin:    Findings: No erythema or rash.     Nails: There is no clubbing.  Neurological:     Mental Status: He is alert.     Diagnostics:    Spirometry was performed and demonstrated an FEV1 of 4.39 at 133 % of predicted.  The patient had an Asthma Control Test with the following results: ACT Total Score: 25.    Assessment and Plan:   1. Asthma, moderate persistent, well-controlled   2. Perennial allergic rhinitis   3. LPRD (laryngopharyngeal reflux disease)   4. Obstructive sleep apnea syndrome     1. Continue immunotherapy  2. Continue to Treat and prevent inflammation:   A. Symbicort 160 - 2 inhalations 1-2 times per day with spacer.  B. OTC Rhinocort - 1 spray each nostril once 3-7 times per week  C. Can attempt to discontinue Montelukast  3. Continue to Treat and prevent reflux:   A. minimize caffeine consumption.   B. OTC omeprazole 20 mg 1 tablet in  a.m.  C. famotidine 40 mg in PM if needed  4.  Continue CPAP for treatment of sleep apnea  5. If needed:   A. ProAir HFA 2 puffs every 4-6 hours  B. nasal saline spray  C. OTC antihistamine  6.  Return to clinic in 6 months or earlier if problem  Kevin [name] is really doing very well and there may be an opportunity to consolidate his medical therapy especially after resolution of this spring season.  He can consolidate his Symbicort to once a day and uses Rhinocort a few times  per week and he can attempt to discontinue his montelukast.  I will assume that with his ongoing immunotherapy this is going to result in significant protection when he has exposure to specific aeroallergens.  He will continue on therapy for reflux as noted above.  I will see him back in his clinic in 6 months or earlier if there is a problem.  Laurette Schimke, MD Allergy / Immunology [facility]

## 2019-08-20 NOTE — Patient Instructions (Signed)
  1. Continue immunotherapy  2. Continue to Treat and prevent inflammation:   A. Symbicort 160 - 2 inhalations 1-2 times per day with spacer.  B. OTC Rhinocort - 1 spray each nostril once 3-7 times per week  C. Can attempt to discontinue Montelukast  3. Continue to Treat and prevent reflux:   A. minimize caffeine consumption.   B. OTC omeprazole 20 mg 1 tablet in a.m.  C. famotidine 40 mg in PM if needed  4.  Continue CPAP for treatment of sleep apnea  5. If needed:   A. ProAir HFA 2 puffs every 4-6 hours  B. nasal saline spray  C. OTC antihistamine  6.  Return to clinic in 6 months or earlier if problem

## 2019-08-21 ENCOUNTER — Encounter: Payer: Self-pay | Admitting: Allergy and Immunology

## 2019-08-28 ENCOUNTER — Ambulatory Visit (INDEPENDENT_AMBULATORY_CARE_PROVIDER_SITE_OTHER): Payer: Federal, State, Local not specified - PPO

## 2019-08-28 DIAGNOSIS — J309 Allergic rhinitis, unspecified: Secondary | ICD-10-CM

## 2019-09-08 ENCOUNTER — Other Ambulatory Visit: Payer: Self-pay | Admitting: Allergy and Immunology

## 2019-09-11 ENCOUNTER — Ambulatory Visit (INDEPENDENT_AMBULATORY_CARE_PROVIDER_SITE_OTHER): Payer: Federal, State, Local not specified - PPO

## 2019-09-11 DIAGNOSIS — J309 Allergic rhinitis, unspecified: Secondary | ICD-10-CM

## 2019-09-25 ENCOUNTER — Ambulatory Visit (INDEPENDENT_AMBULATORY_CARE_PROVIDER_SITE_OTHER): Payer: Federal, State, Local not specified - PPO

## 2019-09-25 DIAGNOSIS — J309 Allergic rhinitis, unspecified: Secondary | ICD-10-CM | POA: Diagnosis not present

## 2019-10-09 ENCOUNTER — Ambulatory Visit (INDEPENDENT_AMBULATORY_CARE_PROVIDER_SITE_OTHER): Payer: Federal, State, Local not specified - PPO

## 2019-10-09 DIAGNOSIS — J309 Allergic rhinitis, unspecified: Secondary | ICD-10-CM | POA: Diagnosis not present

## 2019-10-16 ENCOUNTER — Ambulatory Visit (INDEPENDENT_AMBULATORY_CARE_PROVIDER_SITE_OTHER): Payer: Federal, State, Local not specified - PPO

## 2019-10-16 DIAGNOSIS — J309 Allergic rhinitis, unspecified: Secondary | ICD-10-CM | POA: Diagnosis not present

## 2019-10-23 ENCOUNTER — Ambulatory Visit (INDEPENDENT_AMBULATORY_CARE_PROVIDER_SITE_OTHER): Payer: Federal, State, Local not specified - PPO

## 2019-10-23 DIAGNOSIS — J309 Allergic rhinitis, unspecified: Secondary | ICD-10-CM

## 2019-10-30 ENCOUNTER — Ambulatory Visit (INDEPENDENT_AMBULATORY_CARE_PROVIDER_SITE_OTHER): Payer: Federal, State, Local not specified - PPO

## 2019-10-30 DIAGNOSIS — J309 Allergic rhinitis, unspecified: Secondary | ICD-10-CM | POA: Diagnosis not present

## 2019-11-06 ENCOUNTER — Ambulatory Visit (INDEPENDENT_AMBULATORY_CARE_PROVIDER_SITE_OTHER): Payer: Federal, State, Local not specified - PPO

## 2019-11-06 DIAGNOSIS — J309 Allergic rhinitis, unspecified: Secondary | ICD-10-CM

## 2019-11-13 ENCOUNTER — Encounter: Payer: Self-pay | Admitting: Internal Medicine

## 2019-11-13 ENCOUNTER — Ambulatory Visit: Payer: Federal, State, Local not specified - PPO | Admitting: Internal Medicine

## 2019-11-13 ENCOUNTER — Other Ambulatory Visit: Payer: Self-pay

## 2019-11-13 VITALS — BP 128/80 | HR 97 | Ht 69.0 in | Wt 184.6 lb

## 2019-11-13 DIAGNOSIS — E7800 Pure hypercholesterolemia, unspecified: Secondary | ICD-10-CM | POA: Diagnosis not present

## 2019-11-13 DIAGNOSIS — Z8249 Family history of ischemic heart disease and other diseases of the circulatory system: Secondary | ICD-10-CM | POA: Diagnosis not present

## 2019-11-13 NOTE — Patient Instructions (Signed)
Medication Instructions:  Your physician recommends that you continue on your current medications as directed. Please refer to the Current Medication list given to you today.  *If you need a refill on your cardiac medications before your next appointment, please call your pharmacy*   Lab Work: NONE If you have labs (blood work) drawn today and your tests are completely normal, you will receive your results only by: Marland Kitchen MyChart Message (if you have MyChart) OR . A paper copy in the mail If you have any lab test that is abnormal or we need to change your treatment, we will call you to review the results.   Testing/Procedures: Dr. Rennis Golden has ordered a CT coronary calcium score. This test is done at [address] [address]. This is $150 out of pocket.   Coronary CalciumScan A coronary calcium scan is an imaging test used to look for deposits of calcium and other fatty materials (plaques) in the inner lining of the blood vessels of the heart (coronary arteries). These deposits of calcium and plaques can partly clog and narrow the coronary arteries without producing any symptoms or warning signs. This puts a person at risk for a heart attack. This test can detect these deposits before symptoms develop. Tell a health care provider about:  Any allergies you have.  All medicines you are taking, including vitamins, herbs, eye drops, creams, and over-the-counter medicines.  Any problems you or family members have had with anesthetic medicines.  Any blood disorders you have.  Any surgeries you have had.  Any medical conditions you have.  Whether you are pregnant or may be pregnant. What are the risks? Generally, this is a safe procedure. However, problems may occur, including:  Harm to a pregnant woman and her unborn baby. This test involves the use of radiation. Radiation exposure can be dangerous to a pregnant woman and her unborn baby. If you are pregnant, you generally should not  have this procedure done.  Slight increase in the risk of cancer. This is because of the radiation involved in the test. What happens before the procedure? No preparation is needed for this procedure. What happens during the procedure?  You will undress and remove any jewelry around your neck or chest.  You will put on a hospital gown.  Sticky electrodes will be placed on your chest. The electrodes will be connected to an electrocardiogram (ECG) machine to record a tracing of the electrical activity of your heart.  A CT scanner will take pictures of your heart. During this time, you will be asked to lie still and hold your breath for 2-3 seconds while a picture of your heart is being taken. The procedure may vary among health care providers and hospitals. What happens after the procedure?  You can get dressed.  You can return to your normal activities.  It is up to you to get the results of your test. Ask your health care provider, or the department that is doing the test, when your results will be ready. Summary  A coronary calcium scan is an imaging test used to look for deposits of calcium and other fatty materials (plaques) in the inner lining of the blood vessels of the heart (coronary arteries).  Generally, this is a safe procedure. Tell your health care provider if you are pregnant or may be pregnant.  No preparation is needed for this procedure.  A CT scanner will take pictures of your heart.  You can return to your normal  activities after the scan is done. This information is not intended to replace advice given to you by your health care provider. Make sure you discuss any questions you have with your health care provider. Document Released: 10/08/2007 Document Revised: 02/29/2016 Document Reviewed: 02/29/2016 Elsevier Interactive Patient Education  2017 [employer].     Follow-Up: At [facility], you and your health needs are our priority.  As part of our  continuing mission to provide you with exceptional heart care, we have created designated Provider Care Teams.  These Care Teams include your primary Cardiologist (physician) and Advanced Practice Providers (APPs -  Physician Assistants and Nurse Practitioners) who all work together to provide you with the care you need, when you need it.  We recommend signing up for the patient portal called "MyChart".  Sign up information is provided on this After Visit Summary.  MyChart is used to connect with patients for Virtual Visits (Telemedicine).  Patients are able to view lab/test results, encounter notes, upcoming appointments, etc.  Non-urgent messages can be sent to your provider as well.   To learn more about what you can do with MyChart, go to [email or web address].    Your next appointment:   12 month(s)  The format for your next appointment:   In Person or Virtual  Provider:   You may see Chrystie Nose, MD or one of the following Advanced Practice Providers on your designated Care Team:    Azalee Course, PA-C  Micah Flesher, PA-C or   Judy Pimple, [place]    Other Instructions

## 2019-11-13 NOTE — Progress Notes (Signed)
OFFICE NOTE  Chief Complaint:    Primary Care Physician: Gareth Morgan, MD  HPI:  Kevin [name]  is a 64 year-old gentleman with a history of asthma, dyslipidemia, and a family history of coronary disease. He underwent a cardiac workup in 2009 which was negative for ischemia. He was having palpitations which we felt maybe was related to stress, anxiety or job, or maybe the medications he had been taking. However, those have subsequently gone away. He reports now that he retired this past year and has been busy, however, working on Manufacturing engineer and other physical activities. During that he denies any shortness of breath, chest pain, palpitations, presyncope or syncopal symptoms.  He recently had [name] tori were performed on 02/26/2013. This demonstrated total cholesterol 162, HDL 65, triglycerides 45 and LDL 88. His glucose is 90 creatinine remained 0.86 the rest of his laboratory work is unremarkable. Overall he is doing well and denies any specific symptoms.  Kevin [name] returns today for follow-up. He reports some fatigue which she has perennially in the fall related to seasonal allergies. He denies any worsening chest pain or shortness of breath. He recently had repeat laboratory work which shows a consistently well-controlled lipid profile. Total cholesterol 148, HDL 63, turgor stress 45 and LDL 76. The rest of his laboratory work is within normal limits. He is concerned that some of his fatigue may be related to low testosterone. He's never had that checked. He does see Dr. Patsi Sears who follows him for an elevated PSA in the past however it is since normalized and was 0.7 on recent laboratory work.  Kevin [name] returns today for follow-up. This is an annual visit he seems to be doing fairly well. He occasionally has some problems with shortness of breath related to asthma. He denies any chest pain. Cholesterol is been very well controlled - recent labs from 03/05/2015 demonstrated  total cholesterol 184, HDL 66, triglycerides 74 and LDL 103. This indicates about a 30 point increase in LDL from prior labs. He's quite concerned about this but I tried to reassure him that may be due to less activity or dietary changes, but I would not recommend medicine changes at this time.  03/23/2016  Kevin [name] was seen today for an annual visit. He has no new complaints. He recently saw his PCP and had labwork. His LDL is improved to 88 with TC of 155. He denies any dietary or medication changes. He denies chest pain or dyspnea. He does have seasonal allergies and is seeing an allergist.  11/13/2019  Kevin [name] is seen today in follow-up.  He is again without complaints.  He brought lab work from his primary.  Total cholesterol is now 164, triglycerides 73, HDL 47 LDL 104.  This does represent some increase with no changes in his medications.  He thinks his diet is been pretty stable.  I am concerned about the rise in cholesterol.  We have previously target him to an LDL less than 100 however is not known whether he has coronary disease.  He has had stress testing which has been negative before but does have a strong family history of heart disease and 2 or 3 of his brothers.  PMHx:  Past Medical History:  Diagnosis Date  . ALLERGIC RHINITIS   . Asthma   . Dyslipidemia   . Family history of heart disease   . Hepatitis    3rd Grade  . History of insomnia   . History  of nuclear stress test 08/2007    negative bruce myoview  . History of palpitations     Past Surgical History:  Procedure Laterality Date  . COLONOSCOPY N/A 07/12/2017   Procedure: COLONOSCOPY;  Surgeon: Corbin Ade, MD;  [facility];  Service: Endoscopy;  Laterality: N/A;  . NASAL SEPTOPLASTY W/ TURBINOPLASTY  2000   deviated septum  . POLYPECTOMY  07/12/2017   Procedure: POLYPECTOMY;  Surgeon: Corbin Ade, MD;  [facility];  Service: Endoscopy;;  recto-sigmoid colon  . Sleep Study   07/11/2005   AHI during total sleep 3.68/hr, during REM sleep 5.52/hr  . TONSILLECTOMY    . TRANSTHORACIC ECHOCARDIOGRAM  09/13/2007   EF=>55%, borderline asymmetric LVH; mild MR/TR, normal RVSP; AV mildly sclerotic, trace AV regurg & trave pulm vavle regurg  . varicocoel      FAMHx:  Family History  Problem Relation Age of Onset  . Heart attack Father 36       also CHF & lung problems  . Heart attack Brother 55  . Heart attack Brother 55  . Heart attack Maternal Grandmother   . Thyroid cancer Maternal Grandfather   . Allergic rhinitis Neg Hx   . Angioedema Neg Hx   . Asthma Neg Hx   . Eczema Neg Hx   . Immunodeficiency Neg Hx   . Colon cancer Neg Hx     SOCHx:   reports that he has never smoked. He has never used smokeless tobacco. He reports that he does not drink alcohol and does not use drugs.  ALLERGIES:  No Known Allergies  ROS: Pertinent items noted in HPI and remainder of comprehensive ROS otherwise negative.  HOME MEDS: Current Outpatient Medications  Medication Sig Dispense Refill  . albuterol (PROAIR HFA) 108 (90 Base) MCG/ACT inhaler USE 2 INHALATIONS ORALLY 4 TIMES DAILY AS NEEDED 1 Inhaler 1  . budesonide (RHINOCORT ALLERGY) 32 MCG/ACT nasal spray Place 1 spray into both nostrils daily as needed for allergies.     . budesonide-formoterol (SYMBICORT) 160-4.5 MCG/ACT inhaler USE 2 INHALATIONS ORALLY   INTO THE LUNGS TWO TIMES A DAY 30.6 g 1  . famotidine (PEPCID) 40 MG tablet Take 1 tablet (40 mg total) by mouth at bedtime. 90 tablet 1  . fexofenadine (ALLEGRA) 180 MG tablet Take 180 mg by mouth daily as needed for allergies.     . Methylcellulose, Laxative, 500 MG TABS Take 2 tablets by mouth daily.    . montelukast (SINGULAIR) 10 MG tablet TAKE 1 TABLET BY MOUTH AT BEDTIME 30 tablet 5  . omeprazole (PRILOSEC) 20 MG capsule Take 1 capsule (20 mg total) by mouth daily. 90 capsule 1  . PRESCRIPTION MEDICATION ALLERGY VACCINE -- Use as directed weekly.    .  simvastatin (ZOCOR) 20 MG tablet Take 1 tablet (20 mg total) by mouth at bedtime. 30 tablet 0   No current facility-administered medications for this visit.    LABS/IMAGING: No results found for this or any previous visit (from the past 48 hour(s)). No results found.  VITALS: BP 128/80   Pulse 97   Ht 5\' 9"  (1.753 m)   Wt 184 lb 9.6 oz (83.7 kg)   SpO2 96%   BMI 27.26 kg/m   EXAM: General appearance: alert and no distress Neck: no adenopathy, no carotid bruit, no JVD, supple, symmetrical, trachea midline and thyroid not enlarged, symmetric, no tenderness/mass/nodules Lungs: clear to auscultation bilaterally Heart: regular rate and rhythm, S1, S2 normal, no  murmur, click, rub or gallop Abdomen: soft, non-tender; bowel sounds normal; no masses,  no organomegaly Extremities: extremities normal, atraumatic, no cyanosis or edema Pulses: 2+ and symmetric Skin: Skin color, texture, turgor normal. No rashes or lesions Neurologic: Grossly normal  EKG: Sinus rhythm at 97, incomplete right bundle branch block-personally reviewed  ASSESSMENT: 1. Dyslipidemia 2. Family history of premature coronary disease 3. Anxiety 4. Seasonal allergies/asthma  PLAN: 1.   Kevin [name] continues to be asymptomatic from a cardiac standpoint.  Although he has had negative stress testing in the past, I do not think that that fully tells [place] whether he has any early onset heart disease.  It would be more risk predicted for him to undergo CT coronary calcium scoring.  This will help [place] better assess his target of treatment for LDL.  As his LDLs been rising, if he has significant coronary calcification I would likely recommend an increase in his medication or perhaps adding ezetimibe as well as making dietary changes.  We will provide him also today with some healthy eating options.  Plan to see him back annually or sooner as necessary.  Chrystie Nose, MD, [facility], FACP  [place]  [facility]  Medical  Director of the Advanced Lipid Disorders &  Cardiovascular Risk Reduction Clinic Diplomate of the American Board of Clinical Lipidology Attending Cardiologist  Direct Dial: [phone]  Fax: [phone]  Website:  [email or web address] Kevin [name] 11/13/2019, 2:04 PM

## 2019-11-20 ENCOUNTER — Ambulatory Visit (INDEPENDENT_AMBULATORY_CARE_PROVIDER_SITE_OTHER): Payer: Federal, State, Local not specified - PPO

## 2019-11-20 DIAGNOSIS — J309 Allergic rhinitis, unspecified: Secondary | ICD-10-CM | POA: Diagnosis not present

## 2019-11-25 ENCOUNTER — Ambulatory Visit
Admission: RE | Admit: 2019-11-25 | Discharge: 2019-11-25 | Disposition: A | Discharge status: Home / Self Care | Payer: Self-pay | Source: Ambulatory Visit | Attending: Internal Medicine | Admitting: Internal Medicine

## 2019-11-25 ENCOUNTER — Other Ambulatory Visit: Payer: Self-pay

## 2019-11-25 DIAGNOSIS — E7800 Pure hypercholesterolemia, unspecified: Secondary | ICD-10-CM

## 2019-11-25 DIAGNOSIS — Z8249 Family history of ischemic heart disease and other diseases of the circulatory system: Secondary | ICD-10-CM

## 2019-12-04 ENCOUNTER — Ambulatory Visit (INDEPENDENT_AMBULATORY_CARE_PROVIDER_SITE_OTHER): Payer: Federal, State, Local not specified - PPO

## 2019-12-04 DIAGNOSIS — J309 Allergic rhinitis, unspecified: Secondary | ICD-10-CM

## 2019-12-18 ENCOUNTER — Ambulatory Visit (INDEPENDENT_AMBULATORY_CARE_PROVIDER_SITE_OTHER): Payer: Federal, State, Local not specified - PPO

## 2019-12-18 ENCOUNTER — Other Ambulatory Visit: Payer: Self-pay

## 2019-12-18 ENCOUNTER — Ambulatory Visit: Payer: Federal, State, Local not specified - PPO | Admitting: Pulmonary Disease

## 2019-12-18 ENCOUNTER — Encounter: Payer: Self-pay | Admitting: Pulmonary Disease

## 2019-12-18 VITALS — BP 122/76 | HR 77 | Temp 97.8°F | Ht 69.0 in | Wt 183.2 lb

## 2019-12-18 DIAGNOSIS — J309 Allergic rhinitis, unspecified: Secondary | ICD-10-CM

## 2019-12-18 DIAGNOSIS — G4733 Obstructive sleep apnea (adult) (pediatric): Secondary | ICD-10-CM | POA: Diagnosis not present

## 2019-12-18 DIAGNOSIS — J4530 Mild persistent asthma, uncomplicated: Secondary | ICD-10-CM

## 2019-12-18 DIAGNOSIS — Z9989 Dependence on other enabling machines and devices: Secondary | ICD-10-CM

## 2019-12-18 NOTE — Progress Notes (Signed)
Kevin [name]    [id number]    1956-01-13  Primary Care Physician:Knowlton, Brett Canales, MD  Referring Physician: Gareth Morgan, MD [address] [place],  [place] [address]  Chief complaint:   History of obstructive sleep apnea adequately treated with CPAP therapy Continues to use CPAP on a regular basis and feeling okay   HPI:  Diagnosed with obstructive sleep apnea which is been using CPAP therapy Has a history of allergies CPAP currently at 10-tolerating it better, sleeping better Has no concerns with CPAP today  Mask issues are better  Functioning well, sleeping well at night  Continues to function well, uses Symbicort, rarely uses albuterol Not limited with activities  Occupation: No pertinent history Exposures: Exposure Smoking history: Smoker Travel history: No recent travel Relevant family history:  Outpatient Encounter Medications as of 12/18/2019  Medication Sig  . albuterol (PROAIR HFA) 108 (90 Base) MCG/ACT inhaler USE 2 INHALATIONS ORALLY 4 TIMES DAILY AS NEEDED  . budesonide (RHINOCORT ALLERGY) 32 MCG/ACT nasal spray Place 1 spray into both nostrils daily as needed for allergies.   . budesonide-formoterol (SYMBICORT) 160-4.5 MCG/ACT inhaler USE 2 INHALATIONS ORALLY   INTO THE LUNGS TWO TIMES A DAY  . famotidine (PEPCID) 40 MG tablet Take 1 tablet (40 mg total) by mouth at bedtime.  . fexofenadine (ALLEGRA) 180 MG tablet Take 180 mg by mouth daily as needed for allergies.   . Methylcellulose, Laxative, 500 MG TABS Take 2 tablets by mouth daily.  . montelukast (SINGULAIR) 10 MG tablet TAKE 1 TABLET BY MOUTH AT BEDTIME  . omeprazole (PRILOSEC) 20 MG capsule Take 1 capsule (20 mg total) by mouth daily.  Marland Kitchen PRESCRIPTION MEDICATION ALLERGY VACCINE -- Use as directed weekly.  . simvastatin (ZOCOR) 20 MG tablet Take 1 tablet (20 mg total) by mouth at bedtime.   No facility-administered encounter medications on file as of 12/18/2019.    Allergies as of  12/18/2019  . (No Known Allergies)    Past Medical History:  Diagnosis Date  . ALLERGIC RHINITIS   . Asthma   . Dyslipidemia   . Family history of heart disease   . Hepatitis    3rd Grade  . History of insomnia   . History of nuclear stress test 08/2007    negative bruce myoview  . History of palpitations     Past Surgical History:  Procedure Laterality Date  . COLONOSCOPY N/A 07/12/2017   Procedure: COLONOSCOPY;  Surgeon: Corbin Ade, MD;  [facility];  Service: Endoscopy;  Laterality: N/A;  . NASAL SEPTOPLASTY W/ TURBINOPLASTY  2000   deviated septum  . POLYPECTOMY  07/12/2017   Procedure: POLYPECTOMY;  Surgeon: Corbin Ade, MD;  [facility];  Service: Endoscopy;;  recto-sigmoid colon  . Sleep Study  07/11/2005   AHI during total sleep 3.68/hr, during REM sleep 5.52/hr  . TONSILLECTOMY    . TRANSTHORACIC ECHOCARDIOGRAM  09/13/2007   EF=>55%, borderline asymmetric LVH; mild MR/TR, normal RVSP; AV mildly sclerotic, trace AV regurg & trave pulm vavle regurg  . varicocoel      Family History  Problem Relation Age of Onset  . Heart attack Father 77       also CHF & lung problems  . Heart attack Brother 55  . Heart attack Brother 55  . Heart attack Maternal Grandmother   . Thyroid cancer Maternal Grandfather   . Allergic rhinitis Neg Hx   . Angioedema Neg Hx   .  Asthma Neg Hx   . Eczema Neg Hx   . Immunodeficiency Neg Hx   . Colon cancer Neg Hx     Social History   Socioeconomic History  . Marital status: Married    Spouse name: Not on file  . Number of children: 2  . Years of education: Not on file  . Highest education level: Not on file  Occupational History  . Occupation: Visual merchandiser  Tobacco Use  . Smoking status: Never Smoker  . Smokeless tobacco: Never Used  Vaping Use  . Vaping Use: Never used  Substance and Sexual Activity  . Alcohol use: No  . Drug use: No  . Sexual activity: Not on file  Other Topics  Concern  . Not on file  Social History Narrative  . Not on file   Social Determinants of Health   Financial Resource Strain:   . Difficulty of Paying Living Expenses: Not on file  Food Insecurity:   . Worried About Programme researcher, broadcasting/film/video in the Last Year: Not on file  . Ran Out of Food in the Last Year: Not on file  Transportation Needs:   . Lack of Transportation (Medical): Not on file  . Lack of Transportation (Non-Medical): Not on file  Physical Activity:   . Days of Exercise per Week: Not on file  . Minutes of Exercise per Session: Not on file  Stress:   . Feeling of Stress : Not on file  Social Connections:   . Frequency of Communication with Friends and Family: Not on file  . Frequency of Social Gatherings with Friends and Family: Not on file  . Attends Religious Services: Not on file  . Active Member of Clubs or Organizations: Not on file  . Attends Banker Meetings: Not on file  . Marital Status: Not on file  Intimate Partner Violence:   . Fear of Current or Ex-Partner: Not on file  . Emotionally Abused: Not on file  . Physically Abused: Not on file  . Sexually Abused: Not on file    Review of Systems  Constitutional: Negative.   Respiratory: Positive for apnea. Negative for cough.   Cardiovascular: Negative.   Gastrointestinal: Negative.   Endocrine: Negative.   Psychiatric/Behavioral: Positive for sleep disturbance.  All other systems reviewed and are negative.   Vitals:   12/18/19 1556  BP: 122/76  Pulse: 77  Temp: 97.8 F (36.6 C)  SpO2: 100%      Physical Exam Constitutional:      Appearance: He is well-developed.  HENT:     Head: Normocephalic and atraumatic.  Neck:     Thyroid: No thyromegaly.  Cardiovascular:     Rate and Rhythm: Normal rate and regular rhythm.  Pulmonary:     Effort: Pulmonary effort is normal. No respiratory distress.     Breath sounds: Normal breath sounds. No wheezing or rales.  Musculoskeletal:      Cervical back: Normal range of motion and neck supple.  Neurological:     Mental Status: He is alert.    Data Reviewed: CPAP download reviewed showing 100% compliance He is on CPAP of 10 AHI of 1.7 No significant mask leaks  Assessment:   Obstructive sleep apnea -Adequately treated with CPAP therapy -Pressures better tolerated -Continue same pressure  History of allergies Seasonal allergies Rhinitis -Better controlled  Mild persistent asthma -Continue Symbicort -Rescue inhaler use as needed   Plan/Recommendations:  Continue CPAP of 10  Call with significant concerns  Continue Symbicort  We will see back in the office in 1 year or sooner if any concerns   Virl Diamond MD [facility] 12/18/2019, 4:05 PM  CC: Gareth Morgan, MD

## 2019-12-18 NOTE — Patient Instructions (Signed)
Obstructive sleep apnea -Adequately treated  Continue Symbicort Continue rescue inhaler as needed  I will see you in a year from now

## 2020-01-08 ENCOUNTER — Ambulatory Visit (INDEPENDENT_AMBULATORY_CARE_PROVIDER_SITE_OTHER): Payer: Federal, State, Local not specified - PPO

## 2020-01-08 DIAGNOSIS — J309 Allergic rhinitis, unspecified: Secondary | ICD-10-CM | POA: Diagnosis not present

## 2020-01-13 DIAGNOSIS — J3089 Other allergic rhinitis: Secondary | ICD-10-CM | POA: Diagnosis not present

## 2020-01-13 NOTE — Progress Notes (Signed)
VIALS EXP 01-12-21 

## 2020-01-29 ENCOUNTER — Ambulatory Visit (INDEPENDENT_AMBULATORY_CARE_PROVIDER_SITE_OTHER): Payer: Federal, State, Local not specified - PPO

## 2020-01-29 DIAGNOSIS — J309 Allergic rhinitis, unspecified: Secondary | ICD-10-CM

## 2020-02-19 ENCOUNTER — Ambulatory Visit (INDEPENDENT_AMBULATORY_CARE_PROVIDER_SITE_OTHER): Payer: Federal, State, Local not specified - PPO

## 2020-02-19 DIAGNOSIS — J309 Allergic rhinitis, unspecified: Secondary | ICD-10-CM

## 2020-02-25 ENCOUNTER — Ambulatory Visit: Payer: Federal, State, Local not specified - PPO | Admitting: Allergy and Immunology

## 2020-02-25 ENCOUNTER — Other Ambulatory Visit: Payer: Self-pay

## 2020-02-25 ENCOUNTER — Encounter: Payer: Self-pay | Admitting: Allergy and Immunology

## 2020-02-25 VITALS — BP 118/60 | HR 86 | Temp 98.3°F | Resp 16

## 2020-02-25 DIAGNOSIS — J3089 Other allergic rhinitis: Secondary | ICD-10-CM

## 2020-02-25 DIAGNOSIS — K219 Gastro-esophageal reflux disease without esophagitis: Secondary | ICD-10-CM | POA: Diagnosis not present

## 2020-02-25 DIAGNOSIS — G4733 Obstructive sleep apnea (adult) (pediatric): Secondary | ICD-10-CM | POA: Diagnosis not present

## 2020-02-25 DIAGNOSIS — J4540 Moderate persistent asthma, uncomplicated: Secondary | ICD-10-CM

## 2020-02-25 NOTE — Progress Notes (Signed)
[place] [facility] [place] [facility] [place] [facility] [place] - [place]   Follow-up Note  Referring Provider: Gareth Morgan, MD Primary Provider: Gareth Morgan, MD Date of Office Visit: 02/25/2020  Subjective:   Kevin [name] (DOB: [birth year]) is a 64 y.o. male who [facility] in re-evaluation of the following:  HPI: Kevin [name] returns to this clinic in reevaluation of asthma and allergic rhinitis and LPR and sleep apnea.  His last visit to this clinic was 20 August 2019.  He has had an excellent period of time with his airway.  He has not required a systemic steroid or an antibiotic to treat any type of airway issue.  He rarely uses a short acting bronchodilator and has been able to go through each season of the year without much difficulty.  He continues to use his Symbicort consistently as well as montelukast and rarely uses a nasal steroid.  His reflux has been under excellent control on his current plan.  He still continues to consistently use a proton pump inhibitor and H2 receptor blocker.  He continues to use CPAP for his sleep apnea.  His immunotherapy is currently every 3 weeks without any adverse effect.  He has received 2 Moderna Covid vaccines and is scheduled for a booster this week.  He has received the flu vaccine.  Allergies as of 02/25/2020   No Known Allergies     Medication List      albuterol 108 (90 Base) MCG/ACT inhaler Commonly known as: ProAir HFA USE 2 INHALATIONS ORALLY 4 TIMES DAILY AS NEEDED   budesonide-formoterol 160-4.5 MCG/ACT inhaler Commonly known as: SYMBICORT USE 2 INHALATIONS ORALLY   INTO THE LUNGS TWO TIMES A DAY   famotidine 40 MG tablet Commonly known as: PEPCID Take 1 tablet (40 mg total) by mouth at bedtime.   fexofenadine 180 MG tablet Commonly known as: ALLEGRA Take 180 mg by mouth daily as needed for allergies.   Methylcellulose (Laxative) 500 MG Tabs Take 2 tablets by mouth daily.     montelukast 10 MG tablet Commonly known as: SINGULAIR TAKE 1 TABLET BY MOUTH AT BEDTIME   omeprazole 20 MG capsule Commonly known as: PRILOSEC Take 1 capsule (20 mg total) by mouth daily.   PRESCRIPTION MEDICATION ALLERGY VACCINE -- Use as directed weekly.   Rhinocort Allergy 32 MCG/ACT nasal spray Generic drug: budesonide Place 1 spray into both nostrils daily as needed for allergies.   simvastatin 20 MG tablet Commonly known as: ZOCOR Take 1 tablet (20 mg total) by mouth at bedtime.       Past Medical History:  Diagnosis Date  . ALLERGIC RHINITIS   . Asthma   . Dyslipidemia   . Family history of heart disease   . Hepatitis    3rd Grade  . History of insomnia   . History of nuclear stress test 08/2007    negative bruce myoview  . History of palpitations     Past Surgical History:  Procedure Laterality Date  . COLONOSCOPY N/A 07/12/2017   Procedure: COLONOSCOPY;  Surgeon: Corbin Ade, MD;  [facility];  Service: Endoscopy;  Laterality: N/A;  . NASAL SEPTOPLASTY W/ TURBINOPLASTY  2000   deviated septum  . POLYPECTOMY  07/12/2017   Procedure: POLYPECTOMY;  Surgeon: Corbin Ade, MD;  [facility];  Service: Endoscopy;;  recto-sigmoid colon  . Sleep Study  07/11/2005   AHI during total sleep 3.68/hr, during REM sleep 5.52/hr  .  TONSILLECTOMY    . TRANSTHORACIC ECHOCARDIOGRAM  09/13/2007   EF=>55%, borderline asymmetric LVH; mild MR/TR, normal RVSP; AV mildly sclerotic, trace AV regurg & trave pulm vavle regurg  . varicocoel      Review of systems negative except as noted in HPI / PMHx or noted below:  Review of Systems  Constitutional: Negative.   HENT: Negative.   Eyes: Negative.   Respiratory: Negative.   Cardiovascular: Negative.   Gastrointestinal: Negative.   Genitourinary: Negative.   Musculoskeletal: Negative.   Skin: Negative.   Neurological: Negative.   Endo/Heme/Allergies: Negative.   Psychiatric/Behavioral: Negative.       Objective:   Vitals:   02/25/20 1602  BP: 118/60  Pulse: 86  Resp: 16  Temp: 98.3 F (36.8 C)  SpO2: 95%          Physical Exam Constitutional:      Appearance: He is not diaphoretic.  HENT:     Head: Normocephalic.     Right Ear: Tympanic membrane, ear canal and external ear normal.     Left Ear: Tympanic membrane, ear canal and external ear normal.     Nose: Nose normal. No mucosal edema or rhinorrhea.     Mouth/Throat:     Pharynx: Uvula midline. No oropharyngeal exudate.  Eyes:     Conjunctiva/sclera: Conjunctivae normal.  Neck:     Thyroid: No thyromegaly.     Trachea: Trachea normal. No tracheal tenderness or tracheal deviation.  Cardiovascular:     Rate and Rhythm: Normal rate and regular rhythm.     Heart sounds: Normal heart sounds, S1 normal and S2 normal. No murmur heard.   Pulmonary:     Effort: No respiratory distress.     Breath sounds: Normal breath sounds. No stridor. No wheezing or rales.  Lymphadenopathy:     Head:     Right side of head: No tonsillar adenopathy.     Left side of head: No tonsillar adenopathy.     Cervical: No cervical adenopathy.  Skin:    Findings: No erythema or rash.     Nails: There is no clubbing.  Neurological:     Mental Status: He is alert.     Diagnostics:    Spirometry was performed and demonstrated an FEV1 of 4.22 at 129 % of predicted.  Assessment and Plan:   1. Asthma, moderate persistent, well-controlled   2. Perennial allergic rhinitis   3. LPRD (laryngopharyngeal reflux disease)   4. Obstructive sleep apnea syndrome     1. Continue immunotherapy  2. Continue to Treat and prevent inflammation:   A. Symbicort 160 - 2 inhalations 1-2 times per day with spacer.  B. OTC Rhinocort - 1 spray each nostril once 3-7 times per week  C. Montelukast 10 - 1 tablet 1 time per day  3. Continue to Treat and prevent reflux:   A. minimize caffeine consumption.   B. OTC omeprazole 20 mg 1 tablet in  a.m.  C. famotidine 40 mg in PM if needed  4.  Continue CPAP for treatment of sleep apnea  5. If needed:   A. ProAir HFA 2 puffs every 4-6 hours  B. nasal saline spray  C. OTC antihistamine  6.  Return to clinic in 12 months or earlier if problem  Kevin [name] is really doing very well and he has done well now for a year regarding his atopic respiratory disease on his current plan.  He has a very good understanding about his disease state and  appropriate use of his medications and appropriate dosing of his medications depending on disease activity.  We will continue to have him use a collection of agents directed against respiratory tract inflammation and reflux and we will see him back in his clinic in 12 months or earlier if there is a problem.  Laurette Schimke, MD Allergy / Immunology [facility]

## 2020-02-25 NOTE — Patient Instructions (Signed)
  1. Continue immunotherapy  2. Continue to Treat and prevent inflammation:   A. Symbicort 160 - 2 inhalations 1-2 times per day with spacer.  B. OTC Rhinocort - 1 spray each nostril once 3-7 times per week  C. Montelukast 10 - 1 tablet 1 time per day  3. Continue to Treat and prevent reflux:   A. minimize caffeine consumption.   B. OTC omeprazole 20 mg 1 tablet in a.m.  C. famotidine 40 mg in PM if needed  4.  Continue CPAP for treatment of sleep apnea  5. If needed:   A. ProAir HFA 2 puffs every 4-6 hours  B. nasal saline spray  C. OTC antihistamine  6.  Return to clinic in 12 months or earlier if problem

## 2020-02-26 ENCOUNTER — Encounter: Payer: Self-pay | Admitting: Allergy and Immunology

## 2020-03-06 ENCOUNTER — Other Ambulatory Visit: Payer: Self-pay | Admitting: Allergy and Immunology

## 2020-03-11 ENCOUNTER — Ambulatory Visit (INDEPENDENT_AMBULATORY_CARE_PROVIDER_SITE_OTHER): Payer: Federal, State, Local not specified - PPO

## 2020-03-11 DIAGNOSIS — J309 Allergic rhinitis, unspecified: Secondary | ICD-10-CM | POA: Diagnosis not present

## 2020-03-18 ENCOUNTER — Ambulatory Visit (INDEPENDENT_AMBULATORY_CARE_PROVIDER_SITE_OTHER): Payer: Federal, State, Local not specified - PPO

## 2020-03-18 DIAGNOSIS — J309 Allergic rhinitis, unspecified: Secondary | ICD-10-CM | POA: Diagnosis not present

## 2020-03-20 ENCOUNTER — Other Ambulatory Visit: Payer: Self-pay | Admitting: Allergy and Immunology

## 2020-04-01 ENCOUNTER — Ambulatory Visit (INDEPENDENT_AMBULATORY_CARE_PROVIDER_SITE_OTHER): Payer: Federal, State, Local not specified - PPO

## 2020-04-01 DIAGNOSIS — J309 Allergic rhinitis, unspecified: Secondary | ICD-10-CM

## 2020-04-02 ENCOUNTER — Other Ambulatory Visit: Payer: Self-pay | Admitting: Internal Medicine

## 2020-04-02 DIAGNOSIS — E7800 Pure hypercholesterolemia, unspecified: Secondary | ICD-10-CM

## 2020-04-08 ENCOUNTER — Ambulatory Visit (INDEPENDENT_AMBULATORY_CARE_PROVIDER_SITE_OTHER): Payer: Federal, State, Local not specified - PPO

## 2020-04-08 DIAGNOSIS — J309 Allergic rhinitis, unspecified: Secondary | ICD-10-CM

## 2020-04-15 ENCOUNTER — Ambulatory Visit (INDEPENDENT_AMBULATORY_CARE_PROVIDER_SITE_OTHER): Payer: Federal, State, Local not specified - PPO

## 2020-04-15 DIAGNOSIS — J309 Allergic rhinitis, unspecified: Secondary | ICD-10-CM

## 2020-04-22 ENCOUNTER — Ambulatory Visit (INDEPENDENT_AMBULATORY_CARE_PROVIDER_SITE_OTHER): Payer: Federal, State, Local not specified - PPO

## 2020-04-22 DIAGNOSIS — J309 Allergic rhinitis, unspecified: Secondary | ICD-10-CM

## 2020-05-02 ENCOUNTER — Other Ambulatory Visit: Payer: Self-pay | Admitting: Allergy and Immunology

## 2020-05-13 ENCOUNTER — Ambulatory Visit (INDEPENDENT_AMBULATORY_CARE_PROVIDER_SITE_OTHER): Payer: Federal, State, Local not specified - PPO

## 2020-05-13 DIAGNOSIS — J309 Allergic rhinitis, unspecified: Secondary | ICD-10-CM | POA: Diagnosis not present

## 2020-06-03 ENCOUNTER — Ambulatory Visit (INDEPENDENT_AMBULATORY_CARE_PROVIDER_SITE_OTHER): Payer: Federal, State, Local not specified - PPO

## 2020-06-03 DIAGNOSIS — J309 Allergic rhinitis, unspecified: Secondary | ICD-10-CM

## 2020-06-08 DIAGNOSIS — J3089 Other allergic rhinitis: Secondary | ICD-10-CM | POA: Diagnosis not present

## 2020-06-08 NOTE — Progress Notes (Signed)
VIALS EXP 06-08-21 

## 2020-06-24 ENCOUNTER — Ambulatory Visit (INDEPENDENT_AMBULATORY_CARE_PROVIDER_SITE_OTHER): Payer: Federal, State, Local not specified - PPO

## 2020-06-24 DIAGNOSIS — J309 Allergic rhinitis, unspecified: Secondary | ICD-10-CM | POA: Diagnosis not present

## 2020-07-13 ENCOUNTER — Other Ambulatory Visit: Payer: Self-pay | Admitting: Allergy and Immunology

## 2020-07-15 ENCOUNTER — Ambulatory Visit (INDEPENDENT_AMBULATORY_CARE_PROVIDER_SITE_OTHER): Payer: Federal, State, Local not specified - PPO

## 2020-07-15 DIAGNOSIS — J309 Allergic rhinitis, unspecified: Secondary | ICD-10-CM | POA: Diagnosis not present

## 2020-07-29 ENCOUNTER — Other Ambulatory Visit: Payer: Self-pay | Admitting: Allergy and Immunology

## 2020-08-05 ENCOUNTER — Ambulatory Visit (INDEPENDENT_AMBULATORY_CARE_PROVIDER_SITE_OTHER): Payer: Federal, State, Local not specified - PPO

## 2020-08-05 DIAGNOSIS — J309 Allergic rhinitis, unspecified: Secondary | ICD-10-CM

## 2020-08-26 ENCOUNTER — Ambulatory Visit (INDEPENDENT_AMBULATORY_CARE_PROVIDER_SITE_OTHER): Payer: Federal, State, Local not specified - PPO

## 2020-08-26 DIAGNOSIS — J309 Allergic rhinitis, unspecified: Secondary | ICD-10-CM

## 2020-09-16 ENCOUNTER — Ambulatory Visit (INDEPENDENT_AMBULATORY_CARE_PROVIDER_SITE_OTHER): Payer: Federal, State, Local not specified - PPO

## 2020-09-16 DIAGNOSIS — J309 Allergic rhinitis, unspecified: Secondary | ICD-10-CM | POA: Diagnosis not present

## 2020-09-23 ENCOUNTER — Ambulatory Visit (INDEPENDENT_AMBULATORY_CARE_PROVIDER_SITE_OTHER): Payer: Federal, State, Local not specified - PPO

## 2020-09-23 DIAGNOSIS — J309 Allergic rhinitis, unspecified: Secondary | ICD-10-CM | POA: Diagnosis not present

## 2020-09-30 ENCOUNTER — Ambulatory Visit (INDEPENDENT_AMBULATORY_CARE_PROVIDER_SITE_OTHER): Payer: Federal, State, Local not specified - PPO

## 2020-09-30 DIAGNOSIS — J309 Allergic rhinitis, unspecified: Secondary | ICD-10-CM | POA: Diagnosis not present

## 2020-10-07 ENCOUNTER — Ambulatory Visit (INDEPENDENT_AMBULATORY_CARE_PROVIDER_SITE_OTHER): Payer: Federal, State, Local not specified - PPO

## 2020-10-07 DIAGNOSIS — J309 Allergic rhinitis, unspecified: Secondary | ICD-10-CM

## 2020-10-14 ENCOUNTER — Ambulatory Visit (INDEPENDENT_AMBULATORY_CARE_PROVIDER_SITE_OTHER): Payer: Federal, State, Local not specified - PPO

## 2020-10-14 DIAGNOSIS — J309 Allergic rhinitis, unspecified: Secondary | ICD-10-CM

## 2020-11-04 ENCOUNTER — Ambulatory Visit (INDEPENDENT_AMBULATORY_CARE_PROVIDER_SITE_OTHER): Payer: Federal, State, Local not specified - PPO

## 2020-11-04 DIAGNOSIS — J309 Allergic rhinitis, unspecified: Secondary | ICD-10-CM | POA: Diagnosis not present

## 2020-11-10 ENCOUNTER — Other Ambulatory Visit: Payer: Self-pay | Admitting: Allergy and Immunology

## 2020-11-26 ENCOUNTER — Ambulatory Visit: Payer: Federal, State, Local not specified - PPO | Admitting: Physician Assistant

## 2020-12-02 ENCOUNTER — Ambulatory Visit (INDEPENDENT_AMBULATORY_CARE_PROVIDER_SITE_OTHER): Payer: Federal, State, Local not specified - PPO | Admitting: *Deleted

## 2020-12-02 DIAGNOSIS — J309 Allergic rhinitis, unspecified: Secondary | ICD-10-CM

## 2020-12-30 ENCOUNTER — Ambulatory Visit (INDEPENDENT_AMBULATORY_CARE_PROVIDER_SITE_OTHER): Payer: Federal, State, Local not specified - PPO

## 2020-12-30 DIAGNOSIS — J309 Allergic rhinitis, unspecified: Secondary | ICD-10-CM | POA: Diagnosis not present

## 2021-01-12 NOTE — Progress Notes (Signed)
Cardiology Office Note:    Date:  01/25/2021   ID:  Kevin, [name] Apr 06, 1956, MRN [id number]  PCP:  Gareth Morgan, MD  Cardiologist:  Chrystie Nose, MD   Referring MD: Gareth Morgan, MD   Chief Complaint  Patient presents with   Follow-up    HLD    History of Present Illness:    Kevin [name] is a 65 y.o. male with a hx of asthma, hyperlipidemia, and a strong family history of CAD. He has a history of palpitations that resolved with reduction in stress. He had a nonischemic nuclear stress test in 2009.  He was last seen by Dr. Rennis Golden July 2021. There was concern for increase in LDL with no changes in diet or medications. To better evaluate target LDL, he underwent coronary calcium score in Aug 2021. His coronary calcium score was 13 with a 28 percentile for age and sex matched controls, mid LAD calcification. Given this, his LDL goal was set at less than 100. Dr. Rennis Golden recommended aggressively working on diet with repeat lipids in 6 months.   He returns today for routine follow up. He brings lab work from about 10 months ago. LDL was 84. 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 BP is mildly elevated here, but he is claustrophobic wearing the mask. Generally running SBP in the 120s. Sinus tachycardia attributed to anxiety surrounding mask. Overall, no complaints. No cardiac symptoms.    Past Medical History:  Diagnosis Date   ALLERGIC RHINITIS    Asthma    Dyslipidemia    Family history of heart disease    Hepatitis    3rd Grade   History of insomnia    History of nuclear stress test 08/2007    negative bruce myoview   History of palpitations     Past Surgical History:  Procedure Laterality Date   COLONOSCOPY N/A 07/12/2017   Procedure: COLONOSCOPY;  Surgeon: Corbin Ade, MD;  [facility];  Service: Endoscopy;  Laterality: N/A;   NASAL SEPTOPLASTY W/ TURBINOPLASTY  2000   deviated septum   POLYPECTOMY  07/12/2017    Procedure: POLYPECTOMY;  Surgeon: Corbin Ade, MD;  [facility];  Service: Endoscopy;;  recto-sigmoid colon   Sleep Study  07/11/2005   AHI during total sleep 3.68/hr, during REM sleep 5.52/hr   TONSILLECTOMY     TRANSTHORACIC ECHOCARDIOGRAM  09/13/2007   EF=>55%, borderline asymmetric LVH; mild MR/TR, normal RVSP; AV mildly sclerotic, trace AV regurg & trave pulm vavle regurg   varicocoel      Current Medications: Current Meds  Medication Sig   albuterol (PROAIR HFA) 108 (90 Base) MCG/ACT inhaler USE 2 INHALATIONS ORALLY 4 TIMES DAILY AS NEEDED   budesonide-formoterol (SYMBICORT) 160-4.5 MCG/ACT inhaler USE 2 INHALATIONS ORALLY   INTO THE LUNGS TWO TIMES A DAY   famotidine (PEPCID) 40 MG tablet TAKE 1 TABLET AT BEDTIME   fexofenadine (ALLEGRA) 180 MG tablet Take 180 mg by mouth daily as needed for allergies.    Methylcellulose, Laxative, 500 MG TABS Take 2 tablets by mouth daily.   montelukast (SINGULAIR) 10 MG tablet TAKE 1 TABLET BY MOUTH AT BEDTIME   omeprazole (PRILOSEC) 20 MG capsule TAKE 1 CAPSULE BY MOUTH DAILY   PRESCRIPTION MEDICATION ALLERGY VACCINE -- Use as directed weekly.   simvastatin (ZOCOR) 20 MG tablet Take 1 tablet (20 mg total) by mouth at bedtime.     Allergies:   Patient has no  known allergies.   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  Tobacco Use   Smoking status: Never   Smokeless tobacco: Never  Vaping Use   Vaping Use: Never used  Substance and Sexual Activity   Alcohol use: No   Drug use: No   Sexual activity: Not on file  Other Topics Concern   Not on file  Social History Narrative   Not on file   Social Determinants of Health   Financial Resource Strain: Not on file  Food Insecurity: Not on file  Transportation  Needs: Not on file  Physical Activity: Not on file  Stress: Not on file  Social Connections: Not on file     Family History: The patient's family history includes Heart attack in his maternal grandmother; Heart attack (age of onset: 44) in his brother and brother; Heart attack (age of onset: 25) in his father; Thyroid cancer in his maternal grandfather. There is no history of Allergic rhinitis, Angioedema, Asthma, Eczema, Immunodeficiency, or Colon cancer.  ROS:   Please see the history of present illness.     All other systems reviewed and are negative.  EKGs/Labs/Other Studies Reviewed:    The following studies were reviewed today:  Nuclear stress test 2009 Normal perrusion  EKG:  EKG is  ordered today.  The ekg ordered today demonstrates sinus tachycardia HR 103, iRBBB  Recent Labs: No results found for requested labs within last 8760 hours.  Recent Lipid Panel No results found for: CHOL, TRIG, HDL, CHOLHDL, VLDL, LDLCALC, LDLDIRECT  Physical Exam:    VS:  BP 138/88   Pulse (!) 103   Ht 5\' 9"  (1.753 m)   Wt 184 lb 9.6 oz (83.7 kg)   BMI 27.26 kg/m     Wt Readings from Last 3 Encounters:  01/25/21 184 lb 9.6 oz (83.7 kg)  12/18/19 183 lb 3.2 oz (83.1 kg)  11/13/19 184 lb 9.6 oz (83.7 kg)     GEN:  Well nourished, well developed in no acute distress HEENT: Normal NECK: No JVD; No carotid bruits LYMPHATICS: No lymphadenopathy CARDIAC: RRR, no murmurs, rubs, gallops RESPIRATORY:  Clear to auscultation without rales, wheezing or rhonchi  ABDOMEN: Soft, non-tender, non-distended MUSCULOSKELETAL:  No edema; No deformity  SKIN: Warm and dry NEUROLOGIC:  Alert and oriented x 3 PSYCHIATRIC:  Normal affect   ASSESSMENT:    1. Hypercholesterolemia   2. Family history of premature CAD   3. Mild intermittent asthma without complication   4. Sinus tachycardia    PLAN:    In order of problems listed above:  Hyperlipidemia with LDL goal < 100 - need to check repeat  lipids - will get these with PCP in the next two months and will fax our office - maintained on 20 mg zocor - consider adding zetia if not at goal Dec 2021: Total chol 151 HDL 53 LDL 84 Trig 60   Family history of CAD - coronary calcium score was low - nuclear stress test nonischemic 2009   Sinus tachycardia - attributes sinus tachycardia with anxiety surrounding mask - states his HR generally runs in the 70s   Follow up with me or Dr. 2010 in 1 year.    Medication Adjustments/Labs and Tests Ordered:  Current medicines are reviewed at length with the patient today.  Concerns regarding medicines are outlined above.  Orders Placed This Encounter  Procedures   EKG 12-Lead   No orders of the defined types were placed in this encounter.   Signed, Roe Rutherford Zanetta Dehaan, PA  01/25/2021 2:00 PM    [facility]

## 2021-01-25 ENCOUNTER — Encounter: Payer: Self-pay | Admitting: Physician Assistant

## 2021-01-25 ENCOUNTER — Ambulatory Visit: Payer: Federal, State, Local not specified - PPO | Admitting: Physician Assistant

## 2021-01-25 ENCOUNTER — Other Ambulatory Visit: Payer: Self-pay

## 2021-01-25 VITALS — BP 138/88 | HR 103 | Ht 69.0 in | Wt 184.6 lb

## 2021-01-25 DIAGNOSIS — J4520 Mild intermittent asthma, uncomplicated: Secondary | ICD-10-CM | POA: Diagnosis not present

## 2021-01-25 DIAGNOSIS — Z8249 Family history of ischemic heart disease and other diseases of the circulatory system: Secondary | ICD-10-CM

## 2021-01-25 DIAGNOSIS — R000 Tachycardia, unspecified: Secondary | ICD-10-CM | POA: Diagnosis not present

## 2021-01-25 DIAGNOSIS — E7800 Pure hypercholesterolemia, unspecified: Secondary | ICD-10-CM

## 2021-01-25 NOTE — Patient Instructions (Signed)
Medication Instructions:  Your physician recommends that you continue on your current medications as directed. Please refer to the Current Medication list given to you today.  *If you need a refill on your cardiac medications before your next appointment, please call your pharmacy*  Lab Work: None  If you have labs (blood work) drawn today and your tests are completely normal, you will receive your results only by: MyChart Message (if you have MyChart) OR A paper copy in the mail If you have any lab test that is abnormal or we need to change your treatment, we will call you to review the results.   Testing/Procedures: NONE ordered at this time of appointment   Follow-Up: At [facility], you and your health needs are our priority.  As part of our continuing mission to provide you with exceptional heart care, we have created designated Provider Care Teams.  These Care Teams include your primary Cardiologist (physician) and Advanced Practice Providers (APPs -  Physician Assistants and Nurse Practitioners) who all work together to provide you with the care you need, when you need it.  Your next appointment:   1 year(s)  The format for your next appointment:   In Person  Provider:   Kirtland Bouchard [place] Hilty, MD or Micah Flesher, PA-C  Other Instructions

## 2021-01-27 ENCOUNTER — Ambulatory Visit (INDEPENDENT_AMBULATORY_CARE_PROVIDER_SITE_OTHER): Payer: Federal, State, Local not specified - PPO

## 2021-01-27 DIAGNOSIS — J309 Allergic rhinitis, unspecified: Secondary | ICD-10-CM | POA: Diagnosis not present

## 2021-01-28 DIAGNOSIS — J3089 Other allergic rhinitis: Secondary | ICD-10-CM

## 2021-01-28 NOTE — Progress Notes (Signed)
VIALS MADE. EXP 01-28-22 

## 2021-02-01 ENCOUNTER — Other Ambulatory Visit: Payer: Self-pay | Admitting: Allergy and Immunology

## 2021-02-24 ENCOUNTER — Ambulatory Visit (INDEPENDENT_AMBULATORY_CARE_PROVIDER_SITE_OTHER): Payer: Federal, State, Local not specified - PPO

## 2021-02-24 DIAGNOSIS — J309 Allergic rhinitis, unspecified: Secondary | ICD-10-CM

## 2021-02-28 ENCOUNTER — Other Ambulatory Visit: Payer: Self-pay | Admitting: Allergy and Immunology

## 2021-03-02 ENCOUNTER — Other Ambulatory Visit: Payer: Self-pay

## 2021-03-02 ENCOUNTER — Ambulatory Visit: Payer: Federal, State, Local not specified - PPO | Admitting: Allergy and Immunology

## 2021-03-02 ENCOUNTER — Encounter: Payer: Self-pay | Admitting: Allergy and Immunology

## 2021-03-02 VITALS — BP 138/90 | HR 97 | Temp 98.3°F | Resp 24 | Ht 69.0 in | Wt 184.2 lb

## 2021-03-02 DIAGNOSIS — K219 Gastro-esophageal reflux disease without esophagitis: Secondary | ICD-10-CM

## 2021-03-02 DIAGNOSIS — J4540 Moderate persistent asthma, uncomplicated: Secondary | ICD-10-CM

## 2021-03-02 DIAGNOSIS — G4733 Obstructive sleep apnea (adult) (pediatric): Secondary | ICD-10-CM | POA: Diagnosis not present

## 2021-03-02 DIAGNOSIS — J3089 Other allergic rhinitis: Secondary | ICD-10-CM

## 2021-03-02 MED ORDER — BUDESONIDE-FORMOTEROL FUMARATE 160-4.5 MCG/ACT IN AERO: 2.0000 | INHALATION_SPRAY | Freq: Two times a day (BID) | RESPIRATORY_TRACT | 3 refills | Status: DC

## 2021-03-02 MED ORDER — ALBUTEROL SULFATE HFA 108 (90 BASE) MCG/ACT IN AERS: INHALATION_SPRAY | RESPIRATORY_TRACT | 3 refills | Status: DC

## 2021-03-02 MED ORDER — BUDESONIDE 32 MCG/ACT NA SUSP: 1.0000 | Freq: Every day | NASAL | 3 refills | Status: DC

## 2021-03-02 MED ORDER — FAMOTIDINE 40 MG PO TABS
40.0000 mg | ORAL_TABLET | Freq: Every day | ORAL | 3 refills | Status: DC
Start: 2021-03-02 — End: 2021-03-02

## 2021-03-02 MED ORDER — FAMOTIDINE 40 MG PO TABS: 40.0000 mg | ORAL_TABLET | Freq: Every day | ORAL | 3 refills | Status: DC

## 2021-03-02 MED ORDER — OMEPRAZOLE 20 MG PO CPDR: 20.0000 mg | DELAYED_RELEASE_CAPSULE | Freq: Every morning | ORAL | 3 refills | Status: DC

## 2021-03-02 MED ORDER — MONTELUKAST SODIUM 10 MG PO TABS
10.0000 mg | ORAL_TABLET | Freq: Every day | ORAL | 3 refills | Status: DC
Start: 2021-03-02 — End: 2022-02-09

## 2021-03-02 MED ORDER — BUDESONIDE-FORMOTEROL FUMARATE 160-4.5 MCG/ACT IN AERO
2.0000 | INHALATION_SPRAY | Freq: Two times a day (BID) | RESPIRATORY_TRACT | 3 refills | Status: DC
Start: 2021-03-02 — End: 2021-03-02

## 2021-03-02 NOTE — Patient Instructions (Addendum)
  1. Continue to Treat and prevent inflammation:   A. Symbicort 160 - 2 inhalations 1-2 times per day with spacer.  B. OTC Rhinocort - 1 spray each nostril once 3-7 times per week  C. Montelukast 10 - 1 tablet 1 time per day  D. Immunotherapy  2. Continue to Treat and prevent reflux:   A. OTC omeprazole 20 mg in AM  C. famotidine 40 mg in PM   3.  Continue CPAP for treatment of sleep apnea  4. If needed:   A. ProAir HFA 2 puffs every 4-6 hours  B. nasal saline spray  C. OTC antihistamine  5.  Return to clinic in 12 months or earlier if problem

## 2021-03-02 NOTE — Progress Notes (Signed)
[place] - [place] - [place] - [place] - [place]   Follow-up Note  Referring Provider: Gareth Morgan, MD Primary Provider: Gareth Morgan, MD Date of Office Visit: 03/02/2021  Subjective:   Kevin [name] (DOB: August 22, 1955) is a 65 y.o. male who [facility]21 in re-evaluation of the following:  HPI: Kevin [name] returns to this clinic in evaluation of asthma, allergic rhinitis, LPR, and sleep apnea.  His last visit to this clinic was 25 February 2020.  Over the course of the past year his asthma has been under excellent control and he has not had the need to use a short acting bronchodilator nor systemic steroid to treat an exacerbation while he continues to use Symbicort mostly 1 time per day.  His nose has also been doing relatively well while using a nasal steroid and montelukast.  He does add in an antihistamine on occasion and he has an phenylephrine tablet on occasion.  He has not required a antibiotic to treat an episode of sinusitis.  His reflux is under very good control at this point in time using a combination of omeprazole and famotidine.  His CPAP is still working well for his sleep apnea.  He has obtained 3 COVID vaccinations and this year's flu vaccine.  Currently he is using immunotherapy every 4 weeks without any adverse effect.  Allergies as of 03/02/2021   No Known Allergies      Medication List    albuterol 108 (90 Base) MCG/ACT inhaler Commonly known as: ProAir HFA USE 2 INHALATIONS ORALLY 4 TIMES DAILY AS NEEDED   budesonide 32 MCG/ACT nasal spray Commonly known as: RHINOCORT AQUA Place 1 spray into both nostrils daily. Started by: Jessica Priest, MD   budesonide-formoterol 160-4.5 MCG/ACT inhaler Commonly known as: SYMBICORT Inhale 2 puffs into the lungs in the morning and at bedtime.   famotidine 40 MG tablet Commonly known as: PEPCID Take 1 tablet (40 mg total) by mouth at bedtime.   MUCINEX  ALLERGY PO Take 600 mg by mouth.   fexofenadine 180 MG tablet Commonly known as: ALLEGRA Take 180 mg by mouth daily as needed for allergies.   Methylcellulose (Laxative) 500 MG Tabs Take 2 tablets by mouth daily.   montelukast 10 MG tablet Commonly known as: SINGULAIR Take 1 tablet (10 mg total) by mouth at bedtime.   omeprazole 20 MG capsule Commonly known as: PRILOSEC Take 1 capsule (20 mg total) by mouth every morning. What changed: when to take this Changed by: Kaydince Towles Claudia Pollock, MD   phenylephrine 10 MG Tabs tablet Commonly known as: SUDAFED PE Take 10 mg by mouth every 4 (four) hours as needed.   PRESCRIPTION MEDICATION ALLERGY VACCINE -- Use as directed weekly.   simvastatin 20 MG tablet Commonly known as: ZOCOR Take 1 tablet (20 mg total) by mouth at bedtime.    Past Medical History:  Diagnosis Date   ALLERGIC RHINITIS    Asthma    Dyslipidemia    Family history of heart disease    Hepatitis    3rd Grade   History of insomnia    History of nuclear stress test 08/2007    negative bruce myoview   History of palpitations     Past Surgical History:  Procedure Laterality Date   COLONOSCOPY N/A 07/12/2017   Procedure: COLONOSCOPY;  Surgeon: Corbin Ade, MD;  [facility];  Service: Endoscopy;  Laterality: N/A;   NASAL SEPTOPLASTY W/ TURBINOPLASTY  2000   deviated septum   POLYPECTOMY  07/12/2017   Procedure: POLYPECTOMY;  Surgeon: Corbin Ade, MD;  [facility];  Service: Endoscopy;;  recto-sigmoid colon   Sleep Study  07/11/2005   AHI during total sleep 3.68/hr, during REM sleep 5.52/hr   TONSILLECTOMY     TRANSTHORACIC ECHOCARDIOGRAM  09/13/2007   EF=>55%, borderline asymmetric LVH; mild MR/TR, normal RVSP; AV mildly sclerotic, trace AV regurg & trave pulm vavle regurg   varicocoel      Review of systems negative except as noted in HPI / PMHx or noted below:  Review of Systems  Constitutional: Negative.   HENT: Negative.     Eyes: Negative.   Respiratory: Negative.    Cardiovascular: Negative.   Gastrointestinal: Negative.   Genitourinary: Negative.   Musculoskeletal: Negative.   Skin: Negative.   Neurological: Negative.   Endo/Heme/Allergies: Negative.   Psychiatric/Behavioral: Negative.      Objective:   Vitals:   03/02/21 1354  BP: 138/90  Pulse: 97  Resp: (!) 24  Temp: 98.3 F (36.8 C)  SpO2: 97%   Height: 5\' 9"  (175.3 cm)  Weight: 184 lb 3.2 oz (83.6 kg)   Physical Exam Constitutional:      Appearance: He is not diaphoretic.  HENT:     Head: Normocephalic.     Right Ear: Tympanic membrane, ear canal and external ear normal.     Left Ear: Tympanic membrane, ear canal and external ear normal.     Nose: Nose normal. No mucosal edema or rhinorrhea.     Mouth/Throat:     Pharynx: Uvula midline. No oropharyngeal exudate.  Eyes:     Conjunctiva/sclera: Conjunctivae normal.  Neck:     Thyroid: No thyromegaly.     Trachea: Trachea normal. No tracheal tenderness or tracheal deviation.  Cardiovascular:     Rate and Rhythm: Normal rate and regular rhythm.     Heart sounds: Normal heart sounds, S1 normal and S2 normal. No murmur heard. Pulmonary:     Effort: No respiratory distress.     Breath sounds: Normal breath sounds. No stridor. No wheezing or rales.  Lymphadenopathy:     Head:     Right side of head: No tonsillar adenopathy.     Left side of head: No tonsillar adenopathy.     Cervical: No cervical adenopathy.  Skin:    Findings: No erythema or rash.     Nails: There is no clubbing.  Neurological:     Mental Status: He is alert.    Diagnostics:    Spirometry was performed and demonstrated an FEV1 of 4.21 at 130 % of predicted.  Assessment and Plan:   1. Asthma, moderate persistent, well-controlled   2. Perennial allergic rhinitis   3. LPRD (laryngopharyngeal reflux disease)   4. Obstructive sleep apnea syndrome     1. Continue to Treat and prevent  inflammation:   A. Symbicort 160 - 2 inhalations 1-2 times per day with spacer.  B. OTC Rhinocort - 1 spray each nostril once 3-7 times per week  C. Montelukast 10 - 1 tablet 1 time per day  D. Immunotherapy  2. Continue to Treat and prevent reflux:   A. OTC omeprazole 20 mg in AM  C. famotidine 40 mg in PM   3.  Continue CPAP for treatment of sleep apnea  4. If needed:   A. ProAir HFA 2 puffs every 4-6 hours  B. nasal saline spray  C. OTC antihistamine  5.  Return to clinic in 12 months or earlier if problem  Gavin continues to do very well regarding his multiorgan atopic disease and his reflux induced respiratory disease and his sleep apnea with the plan noted above which includes anti-inflammatory agents for his airway including immunotherapy, pretty aggressive therapy directed against reflux and CPAP.  Assuming he does well with this plan I will see him back in this clinic in 1 year or earlier if there is a problem.  Laurette Schimke, MD Allergy / Immunology [facility]

## 2021-03-03 ENCOUNTER — Encounter: Payer: Self-pay | Admitting: Allergy and Immunology

## 2021-03-24 ENCOUNTER — Ambulatory Visit (INDEPENDENT_AMBULATORY_CARE_PROVIDER_SITE_OTHER): Payer: Federal, State, Local not specified - PPO

## 2021-03-24 DIAGNOSIS — J309 Allergic rhinitis, unspecified: Secondary | ICD-10-CM

## 2021-04-12 ENCOUNTER — Encounter (HOSPITAL_BASED_OUTPATIENT_CLINIC_OR_DEPARTMENT_OTHER): Payer: Self-pay

## 2021-04-21 ENCOUNTER — Ambulatory Visit (INDEPENDENT_AMBULATORY_CARE_PROVIDER_SITE_OTHER): Payer: Federal, State, Local not specified - PPO | Admitting: *Deleted

## 2021-04-21 DIAGNOSIS — J309 Allergic rhinitis, unspecified: Secondary | ICD-10-CM | POA: Diagnosis not present

## 2021-04-28 ENCOUNTER — Ambulatory Visit (INDEPENDENT_AMBULATORY_CARE_PROVIDER_SITE_OTHER): Payer: Federal, State, Local not specified - PPO

## 2021-04-28 DIAGNOSIS — J309 Allergic rhinitis, unspecified: Secondary | ICD-10-CM | POA: Diagnosis not present

## 2021-05-05 ENCOUNTER — Ambulatory Visit (INDEPENDENT_AMBULATORY_CARE_PROVIDER_SITE_OTHER): Payer: Federal, State, Local not specified - PPO

## 2021-05-05 DIAGNOSIS — J309 Allergic rhinitis, unspecified: Secondary | ICD-10-CM

## 2021-05-12 ENCOUNTER — Ambulatory Visit (INDEPENDENT_AMBULATORY_CARE_PROVIDER_SITE_OTHER): Payer: Federal, State, Local not specified - PPO

## 2021-05-12 DIAGNOSIS — J309 Allergic rhinitis, unspecified: Secondary | ICD-10-CM

## 2021-05-19 ENCOUNTER — Ambulatory Visit (INDEPENDENT_AMBULATORY_CARE_PROVIDER_SITE_OTHER): Payer: Federal, State, Local not specified - PPO

## 2021-05-19 DIAGNOSIS — J309 Allergic rhinitis, unspecified: Secondary | ICD-10-CM | POA: Diagnosis not present

## 2021-06-16 ENCOUNTER — Ambulatory Visit (INDEPENDENT_AMBULATORY_CARE_PROVIDER_SITE_OTHER): Payer: Federal, State, Local not specified - PPO

## 2021-06-16 DIAGNOSIS — J309 Allergic rhinitis, unspecified: Secondary | ICD-10-CM

## 2021-07-14 ENCOUNTER — Ambulatory Visit (INDEPENDENT_AMBULATORY_CARE_PROVIDER_SITE_OTHER): Payer: Federal, State, Local not specified - PPO

## 2021-07-14 DIAGNOSIS — J309 Allergic rhinitis, unspecified: Secondary | ICD-10-CM

## 2021-08-11 ENCOUNTER — Ambulatory Visit (INDEPENDENT_AMBULATORY_CARE_PROVIDER_SITE_OTHER): Payer: Federal, State, Local not specified - PPO

## 2021-08-11 DIAGNOSIS — J309 Allergic rhinitis, unspecified: Secondary | ICD-10-CM | POA: Diagnosis not present

## 2021-09-08 ENCOUNTER — Ambulatory Visit (INDEPENDENT_AMBULATORY_CARE_PROVIDER_SITE_OTHER): Payer: Federal, State, Local not specified - PPO

## 2021-09-08 DIAGNOSIS — J309 Allergic rhinitis, unspecified: Secondary | ICD-10-CM

## 2021-09-09 DIAGNOSIS — J3089 Other allergic rhinitis: Secondary | ICD-10-CM | POA: Diagnosis not present

## 2021-09-09 NOTE — Progress Notes (Signed)
VIALS EXP 09-10-22 

## 2021-10-08 ENCOUNTER — Ambulatory Visit (INDEPENDENT_AMBULATORY_CARE_PROVIDER_SITE_OTHER): Payer: Federal, State, Local not specified - PPO

## 2021-10-08 DIAGNOSIS — J309 Allergic rhinitis, unspecified: Secondary | ICD-10-CM

## 2021-11-05 ENCOUNTER — Ambulatory Visit (INDEPENDENT_AMBULATORY_CARE_PROVIDER_SITE_OTHER): Payer: Federal, State, Local not specified - PPO

## 2021-11-05 DIAGNOSIS — J309 Allergic rhinitis, unspecified: Secondary | ICD-10-CM | POA: Diagnosis not present

## 2021-12-01 ENCOUNTER — Ambulatory Visit (INDEPENDENT_AMBULATORY_CARE_PROVIDER_SITE_OTHER): Payer: Federal, State, Local not specified - PPO

## 2021-12-01 DIAGNOSIS — J309 Allergic rhinitis, unspecified: Secondary | ICD-10-CM | POA: Diagnosis not present

## 2021-12-08 ENCOUNTER — Ambulatory Visit (INDEPENDENT_AMBULATORY_CARE_PROVIDER_SITE_OTHER): Payer: Federal, State, Local not specified - PPO

## 2021-12-08 DIAGNOSIS — J309 Allergic rhinitis, unspecified: Secondary | ICD-10-CM

## 2021-12-15 ENCOUNTER — Ambulatory Visit (INDEPENDENT_AMBULATORY_CARE_PROVIDER_SITE_OTHER): Payer: Federal, State, Local not specified - PPO

## 2021-12-15 DIAGNOSIS — J309 Allergic rhinitis, unspecified: Secondary | ICD-10-CM | POA: Diagnosis not present

## 2021-12-17 ENCOUNTER — Encounter (HOSPITAL_BASED_OUTPATIENT_CLINIC_OR_DEPARTMENT_OTHER): Payer: Self-pay

## 2021-12-24 ENCOUNTER — Ambulatory Visit (INDEPENDENT_AMBULATORY_CARE_PROVIDER_SITE_OTHER): Payer: Federal, State, Local not specified - PPO

## 2021-12-24 DIAGNOSIS — J309 Allergic rhinitis, unspecified: Secondary | ICD-10-CM | POA: Diagnosis not present

## 2022-01-05 ENCOUNTER — Ambulatory Visit (INDEPENDENT_AMBULATORY_CARE_PROVIDER_SITE_OTHER): Payer: Federal, State, Local not specified - PPO

## 2022-01-05 DIAGNOSIS — J309 Allergic rhinitis, unspecified: Secondary | ICD-10-CM | POA: Diagnosis not present

## 2022-02-02 ENCOUNTER — Ambulatory Visit (INDEPENDENT_AMBULATORY_CARE_PROVIDER_SITE_OTHER): Payer: Federal, State, Local not specified - PPO | Admitting: *Deleted

## 2022-02-02 DIAGNOSIS — J309 Allergic rhinitis, unspecified: Secondary | ICD-10-CM

## 2022-02-09 ENCOUNTER — Other Ambulatory Visit: Payer: Self-pay | Admitting: Allergy and Immunology

## 2022-03-01 ENCOUNTER — Ambulatory Visit: Payer: Federal, State, Local not specified - PPO | Admitting: Family

## 2022-03-01 ENCOUNTER — Encounter: Payer: Self-pay | Admitting: Family

## 2022-03-01 VITALS — BP 132/86 | HR 101 | Temp 98.2°F

## 2022-03-01 DIAGNOSIS — K219 Gastro-esophageal reflux disease without esophagitis: Secondary | ICD-10-CM

## 2022-03-01 DIAGNOSIS — J3089 Other allergic rhinitis: Secondary | ICD-10-CM

## 2022-03-01 DIAGNOSIS — J4540 Moderate persistent asthma, uncomplicated: Secondary | ICD-10-CM

## 2022-03-01 DIAGNOSIS — T485X5A Adverse effect of other anti-common-cold drugs, initial encounter: Secondary | ICD-10-CM

## 2022-03-01 DIAGNOSIS — G4733 Obstructive sleep apnea (adult) (pediatric): Secondary | ICD-10-CM | POA: Diagnosis not present

## 2022-03-01 MED ORDER — OMEPRAZOLE 20 MG PO CPDR: 20.0000 mg | DELAYED_RELEASE_CAPSULE | Freq: Every day | ORAL | 1 refills | Status: DC

## 2022-03-01 MED ORDER — RYALTRIS 665-25 MCG/ACT NA SUSP: NASAL | 5 refills | Status: DC

## 2022-03-01 MED ORDER — PREDNISONE 10 MG PO TABS: ORAL_TABLET | ORAL | 0 refills | Status: DC

## 2022-03-01 MED ORDER — BUDESONIDE-FORMOTEROL FUMARATE 160-4.5 MCG/ACT IN AERO: 2.0000 | INHALATION_SPRAY | Freq: Two times a day (BID) | RESPIRATORY_TRACT | 3 refills | Status: DC

## 2022-03-01 MED ORDER — MONTELUKAST SODIUM 10 MG PO TABS: ORAL_TABLET | ORAL | 1 refills | Status: DC

## 2022-03-01 MED ORDER — EPINEPHRINE 0.3 MG/0.3ML IJ SOAJ: 0.3000 mg | INTRAMUSCULAR | 1 refills | Status: DC | PRN

## 2022-03-01 MED ORDER — ALBUTEROL SULFATE HFA 108 (90 BASE) MCG/ACT IN AERS: 2.0000 | INHALATION_SPRAY | Freq: Four times a day (QID) | RESPIRATORY_TRACT | 1 refills | Status: DC | PRN

## 2022-03-01 NOTE — Progress Notes (Addendum)
Kevin [name] [id number] Dept: [phone]  FOLLOW UP NOTE  Patient ID: Kevin [name], male    DOB: 08/16/1955  Age: 66 y.o. MRN: [id number] Date of Office Visit: 03/01/2022  Assessment  Chief Complaint: Asthma (Difficulty breathing due to dust with constant congestion )  HPI Kevin [name] is a 66 year old male who presents today for follow-up of moderate persistent asthma-well controlled, perennial allergic rhinitis, laryngopharyngeal reflux disease, and obstructive sleep apnea syndrome.  He was last seen on March 02, 2021 by Dr. Neldon Mc.  He reports since his last office visit, that in December he saw his primary care physician and was diagnosed with low iron.  He was given ferrous sulfate and then his iron went back to normal.  He is no longer taking ferrous sulfate.  Moderate persistent asthma: He reports shortness of breath yesterday and today, productive cough with clear sputum in the morning where he feels like he has a cold all the time, tightness in his chest yesterday and today, and shortness of breath with exertion yesterday and today.  He does mention that albuterol does help with his shortness of breath.  He denies fever, chills, wheezing, and nocturnal awakenings due to breathing problems.  He is on a CPAP.  Since his last office visit he has not required any systemic steroids or made any trips to the emergency room or urgent care due to breathing problems.  He very seldom uses his ProAir.  Yesterday when he had his breathing symptoms he mentions that he did not use his Symbicort yesterday or this morning.  Instructed the importance of using Symbicort every day as prescribed.  He wonders if the dusty weather has caused his asthma to flare.  Perennial allergic rhinitis is reported as not well controlled with nasal congestion and post nasal drip. He sometimes uses nasal decongestant PE every 4 hours and at other times he will use as needed.  He also uses Afrin nasal spray  1-2 times a day.  He continues to take montelukast 10 mg once a day and receive allergy injections per protocol.  He has not been using Rhinocort nasal spray because he did not feel that it was effective.    He denies rhinorrhea, but does mention that during allergy season he does take [place].  He has not had any sinus infections since we last saw him.  He is not certain if allergy injections help, but mentions he has been on allergy injections for a long time.  Laryngopharyngeal reflux disease is reported as controlled with omeprazole 20 mg in the morning and famotidine 40 mg at night.  He denies having any symptoms of reflux.  He also watches what he eats.   Drug Allergies:  No Known Allergies  Review of Systems: Review of Systems  Constitutional:  Negative for chills and fever.  HENT:         Reports nasal congestion and occasional postnasal drip when he has congestion like a cold.  Reports rhinorrhea that is not too bad, but will take Allegra during allergy season  Eyes:        Reports itchy watery eyes, but not bad enough to where he needs eyedrop  Respiratory:  Positive for cough and shortness of breath. Negative for wheezing.        Reports shortness of breath yesterday and today with exertion.  Albuterol does help.  Reports productive cough with clear sputum in the morning, tightness in chest today and yesterday.  He denies wheezing and nocturnal awakenings due to breathing problems  Cardiovascular:  Negative for chest pain and palpitations.  Gastrointestinal:        Denies reflux symptoms with omeprazole 20 mg in the morning and famotidine 40 mg at night  Genitourinary:  Negative for frequency.  Skin:  Negative for itching and rash.  Neurological:  Negative for headaches.     Physical Exam: BP 132/86   Pulse (!) 101   Temp 98.2 F (36.8 C)   SpO2 97%    Physical Exam Constitutional:      Appearance: Normal appearance.  HENT:     Head: Normocephalic and atraumatic.      Comments: Pharynx normal, eyes normal, ears: Unable to see all of bilateral tympanic membrane due to dried cerumen: Nose: Bilateral lower turbinates moderately edematous and slightly erythematous with no drainage noted.    Right Ear: Ear canal and external ear normal.     Left Ear: Ear canal and external ear normal.     Mouth/Throat:     Mouth: Mucous membranes are moist.     Pharynx: Oropharynx is clear.  Eyes:     Conjunctiva/sclera: Conjunctivae normal.  Cardiovascular:     Rate and Rhythm: Regular rhythm.     Heart sounds: Normal heart sounds.  Pulmonary:     Effort: Pulmonary effort is normal.     Breath sounds: Normal breath sounds.     Comments: Lungs clear to auscultation Musculoskeletal:     Cervical back: Neck supple.  Skin:    General: Skin is warm.  Neurological:     Mental Status: He is alert and oriented to person, place, and time.  Psychiatric:        Mood and Affect: Mood normal.        Behavior: Behavior normal.        Thought Content: Thought content normal.        Judgment: Judgment normal.     Diagnostics: FVC 4.74 L (113%), FEV1 4.23 L (137%).  Predicted FVC 4.19 L, predicted FEV1 3.09 L.  Spirometry indicates normal ventilatory function.  Assessment and Plan: 1. Not well controlled moderate persistent asthma   2. Rhinitis medicamentosa   3. Perennial allergic rhinitis   4. LPRD (laryngopharyngeal reflux disease)   5. Obstructive sleep apnea syndrome     Meds ordered this encounter  Medications   Olopatadine-Mometasone (RYALTRIS) 665-25 MCG/ACT SUSP    Sig: Place 1 to 2 sprays in each nostril twice a day for runny nose/drainage down throat/stuffy nose    Dispense:  29 g    Refill:  5    Mobile: [phone]   predniSONE (DELTASONE) 10 MG tablet    Sig: Take 6 tablets by mouth once a day for 3 days, then take 4 tablets by mouth once a day for 3 days, then take 2 tablets by mouth once a day for 3 days, then take 1 tablet by mouth once a day for 3  days and stop    Dispense:  39 tablet    Refill:  0   budesonide-formoterol (SYMBICORT) 160-4.5 MCG/ACT inhaler    Sig: Inhale 2 puffs into the lungs in the morning and at bedtime.    Dispense:  30.6 g    Refill:  3   EPINEPHrine 0.3 mg/0.3 mL IJ SOAJ injection    Sig: Inject 0.3 mg into the muscle as needed for anaphylaxis.    Dispense:  1 each    Refill:  1  montelukast (SINGULAIR) 10 MG tablet    Sig: TAKE 1 TABLET(10 MG) BY MOUTH AT BEDTIME    Dispense:  90 tablet    Refill:  1    **Patient requests 90 days supply**   albuterol (VENTOLIN HFA) 108 (90 Base) MCG/ACT inhaler    Sig: Inhale 2 puffs into the lungs every 6 (six) hours as needed.    Dispense:  18 g    Refill:  1   omeprazole (PRILOSEC) 20 MG capsule    Sig: Take 1 capsule (20 mg total) by mouth daily.    Dispense:  100 capsule    Refill:  1    **Patient requests 90 days supply**    Patient Instructions   1. Continue to Treat and prevent inflammation:   A. Symbicort 160 - 2 inhalations 1-2 times per day with spacer.  Make sure and take this every day  B. Stop Rhinocort -not using.  C. Stop Afrin  D. Stop decongestant (sudafed)  E. Montelukast 10 - 1 tablet 1 time per day  F. Immunotherapy. Refill of Epipen sent  G. Start Ryaltris 1-2 sprays in each nostril twice a day. This will help with nasal congestion and post nasal drip. In the right nostril, point the applicator out toward the right ear. In the left nostril, point the applicator out toward the left ear  H. Start prednisone 10 mg taking 6 tablets (60 mg) once a day for 3 days, then 4 tablets (40 mg) once a day for 3 days, then 2 tablets (20 mg) once a day for 3 days, then 1 tablet (10 mg) once a day for 3 days and then stop  I.  We will get lab work to see if you qualify for an asthma biologic.  We will call you with results once they are back.  2. Continue to Treat and prevent reflux:   A. OTC omeprazole 20 mg in AM  C. famotidine 40 mg in PM   3.   Continue CPAP for treatment of sleep apnea  4. If needed:   A. ProAir HFA 2 puffs every 4-6 hours  B. nasal saline spray  C. OTC antihistamine  5.  Return to clinic in 4 weeks or earlier if problem     Return in about 4 weeks (around 03/29/2022), or if symptoms worsen or fail to improve.    Thank you for the opportunity to care for this patient.  Please do not hesitate to contact me with questions.  Althea Charon, FNP [facility]  I have provided oversight concerning NP evaluation and treatment of this patient's health issues addressed during today's encounter. I agree with the assessment and therapeutic plan as outlined in the note.   Signed,   Jiles Prows, MD,  Allergy and Immunology,  [facility].

## 2022-03-01 NOTE — Patient Instructions (Addendum)
  1. Continue to Treat and prevent inflammation:   A. Symbicort 160 - 2 inhalations 1-2 times per day with spacer.  Make sure and take this every day  B. Stop Rhinocort -not using.  C. Stop Afrin  D. Stop decongestant (sudafed)  E. Montelukast 10 - 1 tablet 1 time per day  F. Immunotherapy. Refill of Epipen sent  G. Start Ryaltris 1-2 sprays in each nostril twice a day. This will help with nasal congestion and post nasal drip. In the right nostril, point the applicator out toward the right ear. In the left nostril, point the applicator out toward the left ear  H. Start prednisone 10 mg taking 6 tablets (60 mg) once a day for 3 days, then 4 tablets (40 mg) once a day for 3 days, then 2 tablets (20 mg) once a day for 3 days, then 1 tablet (10 mg) once a day for 3 days and then stop  I.  We will get lab work to see if you qualify for an asthma biologic.  We will call you with results once they are back.  2. Continue to Treat and prevent reflux:   A. OTC omeprazole 20 mg in AM  C. famotidine 40 mg in PM   3.  Continue CPAP for treatment of sleep apnea  4. If needed:   A. ProAir HFA 2 puffs every 4-6 hours  B. nasal saline spray  C. OTC antihistamine  5.  Return to clinic in 4 weeks or earlier if problem

## 2022-03-02 ENCOUNTER — Ambulatory Visit (INDEPENDENT_AMBULATORY_CARE_PROVIDER_SITE_OTHER): Payer: Federal, State, Local not specified - PPO

## 2022-03-02 DIAGNOSIS — J309 Allergic rhinitis, unspecified: Secondary | ICD-10-CM

## 2022-03-04 LAB — CBC WITH DIFFERENTIAL
Basophils Absolute: 0 10*3/uL (ref 0.0–0.2)
Basos: 0 %
EOS (ABSOLUTE): 0.2 10*3/uL (ref 0.0–0.4)
Eos: 3 %
Hematocrit: 45.1 % (ref 37.5–51.0)
Hemoglobin: 15.1 g/dL (ref 13.0–17.7)
Immature Grans (Abs): 0 10*3/uL (ref 0.0–0.1)
Immature Granulocytes: 0 %
Lymphocytes Absolute: 1.2 10*3/uL (ref 0.7–3.1)
Lymphs: 18 %
MCH: 32.1 pg (ref 26.6–33.0)
MCHC: 33.5 g/dL (ref 31.5–35.7)
MCV: 96 fL (ref 79–97)
Monocytes Absolute: 0.5 10*3/uL (ref 0.1–0.9)
Monocytes: 7 %
Neutrophils Absolute: 5 10*3/uL (ref 1.4–7.0)
Neutrophils: 72 %
RBC: 4.71 x10E6/uL (ref 4.14–5.80)
RDW: 12.3 % (ref 11.6–15.4)
WBC: 6.9 10*3/uL (ref 3.4–10.8)

## 2022-03-04 LAB — ALLERGENS W/TOTAL IGE AREA 2
Alternaria Alternata IgE: <0.1 kU/L
Aspergillus Fumigatus IgE: <0.1 kU/L
Bermuda Grass IgE: <0.1 kU/L
Cat Dander IgE: <0.1 kU/L
Cedar, Mountain IgE: <0.1 kU/L
Cladosporium Herbarum IgE: <0.1 kU/L
Cockroach, German IgE: <0.1 kU/L
Common Silver Birch IgE: <0.1 kU/L
Cottonwood IgE: <0.1 kU/L
D Farinae IgE: <0.1 kU/L
D Pteronyssinus IgE: <0.1 kU/L
Dog Dander IgE: <0.1 kU/L
Elm, American IgE: <0.1 kU/L
IgE (Immunoglobulin E), Serum: 84 IU/mL (ref 6–495)
Johnson Grass IgE: <0.1 kU/L
Maple/Box Elder IgE: <0.1 kU/L
Mouse Urine IgE: <0.1 kU/L
Oak, White IgE: <0.1 kU/L
Pecan, Hickory IgE: 0.12 kU/L — AB
Penicillium Chrysogen IgE: <0.1 kU/L
Pigweed, Rough IgE: <0.1 kU/L
Ragweed, Short IgE: 0.69 kU/L — AB
Sheep Sorrel IgE Qn: <0.1 kU/L
Timothy Grass IgE: <0.1 kU/L
White Mulberry IgE: <0.1 kU/L

## 2022-03-07 ENCOUNTER — Ambulatory Visit: Payer: Federal, State, Local not specified - PPO | Attending: Internal Medicine | Admitting: Internal Medicine

## 2022-03-07 ENCOUNTER — Encounter: Payer: Self-pay | Admitting: Internal Medicine

## 2022-03-07 VITALS — BP 143/88 | HR 106 | Ht 69.0 in | Wt 187.0 lb

## 2022-03-07 DIAGNOSIS — I2510 Atherosclerotic heart disease of native coronary artery without angina pectoris: Secondary | ICD-10-CM | POA: Diagnosis not present

## 2022-03-07 DIAGNOSIS — Z8249 Family history of ischemic heart disease and other diseases of the circulatory system: Secondary | ICD-10-CM | POA: Diagnosis not present

## 2022-03-07 DIAGNOSIS — E7800 Pure hypercholesterolemia, unspecified: Secondary | ICD-10-CM

## 2022-03-07 DIAGNOSIS — R000 Tachycardia, unspecified: Secondary | ICD-10-CM

## 2022-03-07 DIAGNOSIS — I2584 Coronary atherosclerosis due to calcified coronary lesion: Secondary | ICD-10-CM

## 2022-03-07 NOTE — Progress Notes (Signed)
Please let Kevin [name] know that his lab work for environmental allergies was low to one tree pollen [facility]) and moderately elevated to ragweed.  Also, his eosinophils are elevated enough to qualify him for a biologic drug to help get better control of his asthma if needed. As we discussed at your last office visit, elevated eosinophils can cause inflammation in your lungs. Please have him schedule his 4 week follow up appointment to discuss more.  Nehemiah Settle, FNP [facility] 

## 2022-03-07 NOTE — Patient Instructions (Signed)

## 2022-03-07 NOTE — Progress Notes (Signed)
OFFICE NOTE  Chief Complaint:  No complaints  Primary Care Physician: Gareth Morgan, MD  HPI:  Kevin [name]  is a 66 year-old gentleman with a history of asthma, dyslipidemia, and a family history of coronary disease. He underwent a cardiac workup in 2009 which was negative for ischemia. He was having palpitations which we felt maybe was related to stress, anxiety or job, or maybe the medications he had been taking. However, those have subsequently gone away. He reports now that he retired this past year and has been busy, however, working on Manufacturing engineer and other physical activities. During that he denies any shortness of breath, chest pain, palpitations, presyncope or syncopal symptoms.  He recently had lambert tori were performed on 02/26/2013. This demonstrated total cholesterol 162, HDL 65, triglycerides 45 and LDL 88. His glucose is 90 creatinine remained 0.86 the rest of his laboratory work is unremarkable. Overall he is doing well and denies any specific symptoms.  Kevin [name] returns today for follow-up. He reports some fatigue which she has perennially in the fall related to seasonal allergies. He denies any worsening chest pain or shortness of breath. He recently had repeat laboratory work which shows a consistently well-controlled lipid profile. Total cholesterol 148, HDL 63, turgor stress 45 and LDL 76. The rest of his laboratory work is within normal limits. He is concerned that some of his fatigue may be related to low testosterone. He's never had that checked. He does see Dr. Patsi Sears who follows him for an elevated PSA in the past however it is since normalized and was 0.7 on recent laboratory work.  Kevin [name] returns today for follow-up. This is an annual visit he seems to be doing fairly well. He occasionally has some problems with shortness of breath related to asthma. He denies any chest pain. Cholesterol is been very well controlled - recent labs from 03/05/2015  demonstrated total cholesterol 184, HDL 66, triglycerides 74 and LDL 103. This indicates about a 30 point increase in LDL from prior labs. He's quite concerned about this but I tried to reassure him that may be due to less activity or dietary changes, but I would not recommend medicine changes at this time.  03/23/2016  Kevin [name] was seen today for an annual visit. He has no new complaints. He recently saw his PCP and had labwork. His LDL is improved to 88 with TC of 155. He denies any dietary or medication changes. He denies chest pain or dyspnea. He does have seasonal allergies and is seeing an allergist.  11/13/2019  Kevin [name] is seen today in follow-up.  He is again without complaints.  He brought lab work from his primary.  Total cholesterol is now 164, triglycerides 73, HDL 47 LDL 104.  This does represent some increase with no changes in his medications.  He thinks his diet is been pretty stable.  I am concerned about the rise in cholesterol.  We have previously target him to an LDL less than 100 however is not known whether he has coronary disease.  He has had stress testing which has been negative before but does have a strong family history of heart disease and 2 or 3 of his brothers.  03/07/2022  Kevin [name] seen today in follow-up.  Overall he says he is feeling pretty well.  Blood pressure was a little elevated today.  He is cholesterol has been well controlled and is scheduled to be reassessed with an upcoming physical.  He  has struggled with some allergies.  He is recently been on therapy by an allergist including prednisone for the past several days.  He says he feels "wired" on the prednisone.  This may be responsible for his tachycardia and mild hypertension today.  He denies any chest pain.  His calcium score showed very little coronary calcium at 13, 28th percentile for age and sex matched controls in the LAD in 2021.  PMHx:  Past Medical History:  Diagnosis Date   ALLERGIC  RHINITIS    Asthma    Dyslipidemia    Family history of heart disease    Hepatitis    3rd Grade   History of insomnia    History of nuclear stress test 08/2007    negative bruce myoview   History of palpitations     Past Surgical History:  Procedure Laterality Date   COLONOSCOPY N/A 07/12/2017   Procedure: COLONOSCOPY;  Surgeon: Corbin Ade, MD;  Location: [facility];  Service: Endoscopy;  Laterality: N/A;   NASAL SEPTOPLASTY W/ TURBINOPLASTY  2000   deviated septum   POLYPECTOMY  07/12/2017   Procedure: POLYPECTOMY;  Surgeon: Corbin Ade, MD;  Location: [facility];  Service: Endoscopy;;  recto-sigmoid colon   Sleep Study  07/11/2005   AHI during total sleep 3.68/hr, during REM sleep 5.52/hr   TONSILLECTOMY     TRANSTHORACIC ECHOCARDIOGRAM  09/13/2007   EF=>55%, borderline asymmetric LVH; mild MR/TR, normal RVSP; AV mildly sclerotic, trace AV regurg & trave pulm vavle regurg   varicocoel      FAMHx:  Family History  Problem Relation Age of Onset   Heart attack Father 67       also CHF & lung problems   Heart attack Brother 55   Heart attack Brother 55   Heart attack Maternal Grandmother    Thyroid cancer Maternal Grandfather    Allergic rhinitis Neg Hx    Angioedema Neg Hx    Asthma Neg Hx    Eczema Neg Hx    Immunodeficiency Neg Hx    Colon cancer Neg Hx     SOCHx:   reports that he has never smoked. He has never used smokeless tobacco. He reports that he does not drink alcohol and does not use drugs.  ALLERGIES:  No Known Allergies  ROS: Pertinent items noted in HPI and remainder of comprehensive ROS otherwise negative.  HOME MEDS: Current Outpatient Medications  Medication Sig Dispense Refill   albuterol (VENTOLIN HFA) 108 (90 Base) MCG/ACT inhaler Inhale 2 puffs into the lungs every 6 (six) hours as needed. 18 g 1   budesonide (RHINOCORT AQUA) 32 MCG/ACT nasal spray Place 1 spray into both nostrils daily. 27 g 3   budesonide-formoterol  (SYMBICORT) 160-4.5 MCG/ACT inhaler Inhale 2 puffs into the lungs in the morning and at bedtime. 30.6 g 3   EPINEPHrine 0.3 mg/0.3 mL IJ SOAJ injection Inject 0.3 mg into the muscle as needed for anaphylaxis. 1 each 1   famotidine (PEPCID) 40 MG tablet Take 1 tablet (40 mg total) by mouth at bedtime. 90 tablet 3   Fexofenadine HCl (MUCINEX ALLERGY PO) Take 600 mg by mouth.     Methylcellulose, Laxative, 500 MG TABS Take 2 tablets by mouth daily.     Misc Natural Products (OSTEO BI-FLEX ADV JOINT SHIELD PO) Take 1 tablet by mouth daily.     montelukast (SINGULAIR) 10 MG tablet TAKE 1 TABLET(10 MG) BY MOUTH AT BEDTIME 90 tablet 1   Olopatadine-Mometasone (  RYALTRIS) [id number] MCG/ACT SUSP Place 1 to 2 sprays in each nostril twice a day for runny nose/drainage down throat/stuffy nose 29 g 5   omeprazole (PRILOSEC) 20 MG capsule Take 1 capsule (20 mg total) by mouth daily. 100 capsule 1   phenylephrine (SUDAFED PE) 10 MG TABS tablet Take 10 mg by mouth every 4 (four) hours as needed.     predniSONE (DELTASONE) 10 MG tablet Take 6 tablets by mouth once a day for 3 days, then take 4 tablets by mouth once a day for 3 days, then take 2 tablets by mouth once a day for 3 days, then take 1 tablet by mouth once a day for 3 days and stop 39 tablet 0   PRESCRIPTION MEDICATION ALLERGY VACCINE -- Use as directed weekly.     simvastatin (ZOCOR) 20 MG tablet Take 1 tablet (20 mg total) by mouth at bedtime. 30 tablet 0   No current facility-administered medications for this visit.    LABS/IMAGING: No results found for this or any previous visit (from the past 48 hour(s)). No results found.  VITALS: BP (!) 143/88 (BP Location: Left Arm, Patient Position: Sitting)   Pulse (!) 106   Ht 5\' 9"  (1.753 m)   Wt 187 lb (84.8 kg)   SpO2 100%   BMI 27.62 kg/m   EXAM: General appearance: alert and no distress Neck: no adenopathy, no carotid bruit, no JVD, supple, symmetrical, trachea midline and thyroid not enlarged,  symmetric, no tenderness/mass/nodules Lungs: clear to auscultation bilaterally Heart: regular rate and rhythm, S1, S2 normal, no murmur, click, rub or gallop Abdomen: soft, non-tender; bowel sounds normal; no masses,  no organomegaly Extremities: extremities normal, atraumatic, no cyanosis or edema Pulses: 2+ and symmetric Skin: Skin color, texture, turgor normal. No rashes or lesions Neurologic: Grossly normal  EKG: Sinus tachycardia at 106-personally reviewed  ASSESSMENT: Dyslipidemia Family history of premature coronary disease Anxiety Seasonal allergies/asthma CAC score of 13, 28th percentile (11/2019)  PLAN: 1.   Kevin [name] is doing well.  He had a very low calcium score in 2021.  Blood pressure and heart rate were elevated today however recently was placed on steroids which she is weaning down on.  He says he feels very activated on these.  He is due for upcoming lab work through his PCP which I asked them to send to me to review.  Otherwise no changes to his meds today.  Plan to see him back annually or sooner as necessary.  2022, MD, [facility], FACP    [facility]  Medical Director of the [facility]  [facility] Reduction [facility] Diplomate of the American Board of Clinical Lipidology Attending Cardiologist  Direct Dial: [phone]  Fax: [phone]  Website:  [email or web address] Rockney Grenz 03/07/2022, 10:38 AM

## 2022-03-16 NOTE — Progress Notes (Signed)
Thank you :)

## 2022-03-24 DIAGNOSIS — J3089 Other allergic rhinitis: Secondary | ICD-10-CM

## 2022-03-24 NOTE — Progress Notes (Signed)
VIALS EXP 03-25-23 

## 2022-03-30 ENCOUNTER — Ambulatory Visit (INDEPENDENT_AMBULATORY_CARE_PROVIDER_SITE_OTHER): Payer: Federal, State, Local not specified - PPO

## 2022-03-30 DIAGNOSIS — J309 Allergic rhinitis, unspecified: Secondary | ICD-10-CM

## 2022-04-03 NOTE — Patient Instructions (Incomplete)
  1. Continue to Treat and prevent inflammation:   A. Symbicort 160/4.5 mcg - 2 inhalations 1-2 times per day with spacer.  Make sure and take this every day  B. Montelukast 10 - 1 tablet 1 time per day  C. Immunotherapy. Refill of Epipen sent  D. Continue Ryaltris 1-2 sprays in each nostril twice a day. This will help with nasal congestion and post nasal drip. In the right nostril, point the applicator out toward the right ear. In the left nostril, point the applicator out toward the left ear     2. Continue to Treat and prevent reflux:   A. OTC omeprazole 20 mg in AM  C. famotidine 40 mg in PM   3.  Continue CPAP for treatment of sleep apnea  4. If needed:   A. ProAir HFA 2 puffs every 4-6 hours  B. nasal saline spray  C. OTC antihistamine  5.  Return to clinic in months or earlier if problem

## 2022-04-04 ENCOUNTER — Encounter: Payer: Self-pay | Admitting: Family

## 2022-04-04 ENCOUNTER — Ambulatory Visit (INDEPENDENT_AMBULATORY_CARE_PROVIDER_SITE_OTHER): Payer: Federal, State, Local not specified - PPO | Admitting: Family

## 2022-04-04 VITALS — BP 120/80 | HR 88 | Temp 98.2°F | Resp 16

## 2022-04-04 DIAGNOSIS — J3089 Other allergic rhinitis: Secondary | ICD-10-CM | POA: Diagnosis not present

## 2022-04-04 DIAGNOSIS — G4733 Obstructive sleep apnea (adult) (pediatric): Secondary | ICD-10-CM | POA: Diagnosis not present

## 2022-04-04 DIAGNOSIS — J4540 Moderate persistent asthma, uncomplicated: Secondary | ICD-10-CM | POA: Diagnosis not present

## 2022-04-04 DIAGNOSIS — K219 Gastro-esophageal reflux disease without esophagitis: Secondary | ICD-10-CM

## 2022-04-04 NOTE — Progress Notes (Signed)
[address] [place] [address] Dept: [phone]  FOLLOW UP NOTE  Patient ID: Kevin [name], male    DOB: [birth year]  Age: 66 y.o. MRN: [id number] Date of Office Visit: 04/04/2022  Assessment  Chief Complaint: Asthma  HPI Kevin [name] is a 66 year old male who presents today for follow-up of not well-controlled moderate persistent asthma, rhinitis medicamentosa, perennial allergic rhinitis, laryngopharyngeal reflux disease, and obstructive sleep apnea syndrome.  He was last seen on March 01, 2022 by myself.  He denies any new diagnosis or surgeries since his last office visit.  Moderate persistent asthma: He is currently taking Symbicort 160/4.5 mcg 2 puffs twice a day and was using his albuterol 2 puffs twice a day for a while and now 1 puff daily just trying to get his lungs better.  He did not feel like he really needed the albuterol.  He also mentions he generally very seldom uses albuterol prior to using it daily.  He reports Saturday he felt like he had a chest cold and took 600 mg of Mucinex and it helped.  He also took some today.  The congestion in his chest is better.  He has had a little bit of a dry cough.  He denies fever, chills tightness in chest, shortness of breath, wheezing, and nocturnal awakenings due to breathing problems.  He has not received any other steroids other than those for his rhinitis medicamentosa that I prescribed at his last office visit.  Discussed decreasing his albuterol use to as needed.  Also, discussed his AEC of 200 qualifies him for a biologic to help his asthma in the future if needed.  He would like to hold off on trying a biologic for now.  Rhinitis mediacamentosa: He reports that he is no longer using Afrin and finished the prednisone taper that was prescribed at his last office visit.  He does feel like the Ryaltris that was prescribed at the last office visit really helped.  Perennial allergic rhinitis: He reports that he sneezed twice  last night and he also has a little bit of postnasal drip.  He denies rhinorrhea and nasal congestion.  He has not had any sinus infections since we last saw him.  He continues to take Singulair 10 mg once a day, Ryaltris 1 spray each nostril twice a day, and allergy injections per protocol.  He reports that he has struggled with his allergies since he was 65-50 years old.  There was a period where he went off allergy injections and his allergies got worse.  He has now been back on allergy injections for 4 to 5 years.  Discussed his lab work showing him being positive to ragweed and hickory tree.  For now he would like to continue to receive his injections for grass pollen, dust mite, and weed because he will have some symptoms when he is mowing.  He does think that this could also be dust.  He does wear a mask when he mows.  Laryngopharyngeal reflux disease: He reports that he can aggravate his reflux if he wants to.  Sausage does bother him.  When he does have reflux he will add on Tums to his already scheduled omeprazole in the morning and Pepcid 40 mg at night.  Otherwise if he is not eating foods that can aggravate his reflux, his reflux is good.  Obstructive sleep apnea: He continues to wear his CPAP at night and reports that it really helps.   Drug Allergies:  No Known Allergies  Review of Systems: Review of Systems  Constitutional:  Negative for chills and fever.  HENT:         Reports a little bit of postnasal drip and denies rhinorrhea and nasal congestion  Eyes:        Denies itchy watery eyes  Respiratory:  Positive for cough. Negative for shortness of breath and wheezing.   Cardiovascular:  Negative for chest pain and palpitations.  Gastrointestinal:        Reports reflux can be aggravated if he eats something that he should not.  Otherwise he does not have any problems  Genitourinary:  Negative for frequency.  Skin:  Negative for itching and rash.  Neurological:  Negative for  headaches.  Endo/Heme/Allergies:  Positive for environmental allergies.     Physical Exam: BP 120/80   Pulse 88   Temp 98.2 F (36.8 C) (Temporal)   Resp 16   SpO2 98%    Physical Exam Constitutional:      Appearance: Normal appearance.  HENT:     Head: Normocephalic and atraumatic.     Comments: Pharynx normal, eyes normal, ears normal, nose normal    Right Ear: Tympanic membrane, ear canal and external ear normal.     Left Ear: Tympanic membrane, ear canal and external ear normal.     Nose: Nose normal.     Mouth/Throat:     Mouth: Mucous membranes are moist.     Pharynx: Oropharynx is clear.  Eyes:     Conjunctiva/sclera: Conjunctivae normal.  Cardiovascular:     Rate and Rhythm: Normal rate and regular rhythm.     Heart sounds: Normal heart sounds.  Pulmonary:     Effort: Pulmonary effort is normal.     Breath sounds: Normal breath sounds.     Comments: Lungs clear to auscultation Musculoskeletal:     Cervical back: Neck supple.  Skin:    General: Skin is warm.  Neurological:     Mental Status: He is alert and oriented to person, place, and time.  Psychiatric:        Mood and Affect: Mood normal.        Behavior: Behavior normal.        Thought Content: Thought content normal.        Judgment: Judgment normal.     Diagnostics: FVC 3.91 L (94%), FEV1 3.45 L (109%).  Predicted FVC 4.16 L, predicted FEV1 3.17 L.  Spirometry indicates normal respiratory function.  Assessment and Plan: 1. Asthma, moderate persistent, well-controlled   2. Perennial allergic rhinitis   3. LPRD (laryngopharyngeal reflux disease)   4. Obstructive sleep apnea syndrome     No orders of the defined types were placed in this encounter.   Patient Instructions   1. Continue to Treat and prevent inflammation:   A. Symbicort 160/4.5 mcg - 2 inhalations 1-2 times per day with spacer.  Make sure and take this every day  B. Montelukast 10 - 1 tablet 1 time per day  C. Immunotherapy.  Refill of Epipen sent  D. Continue Ryaltris 1-2 sprays in each nostril twice a day. This will help with nasal congestion and post nasal drip. In the right nostril, point the applicator out toward the right ear. In the left nostril, point the applicator out toward the left ear     2. Continue to Treat and prevent reflux:   A. OTC omeprazole 20 mg in AM  C. famotidine 40 mg in PM  3.  Continue CPAP for treatment of sleep apnea  4. If needed:   A. ProAir HFA 2 puffs every 4-6 hours ( use this as needed now rather than daily)  B. nasal saline spray  C. OTC antihistamine  5.  Return to clinic in 4-6 months or earlier if problem     Return in about 6 months (around 10/04/2022), or if symptoms worsen or fail to improve.    Thank you for the opportunity to care for this patient.  Please do not hesitate to contact me with questions.  Althea Charon, FNP [facility] [place]

## 2022-04-21 IMAGING — CT CT CARDIAC CORONARY ARTERY CALCIUM SCORE
3 series · 14 of 20 positions shown, 15 images · non-contrast
Comparison: None.
COMPARISON: None.

Addendum:
EXAM:
OVER-READ INTERPRETATION  CT CHEST

The following report is an over-read performed by radiologist Dr.
Truman Olszewski [REDACTED] on 11/25/2019. This over-read
does not include interpretation of cardiac or coronary anatomy or
pathology. The coronary calcium score interpretation by the
cardiologist is attached.
CLINICAL DATA: Risk stratification 64 year old with family history
of CAD
Coronary Calcium Score
TECHNIQUE: The patient was scanned on a Siemens Force scanner. Axial
non-contrast 3 mm slices were carried out through the heart. The
data set was analyzed on a dedicated work station and scored using
the Agatson method.

[Series 2: casc 3.0 bv41 2 bestsyst 33 % · axial · 0.36mm/px · z∈[-211,-133]mm · 4 of 44 slices shown, 5 images]
[im 9/44  vessel]
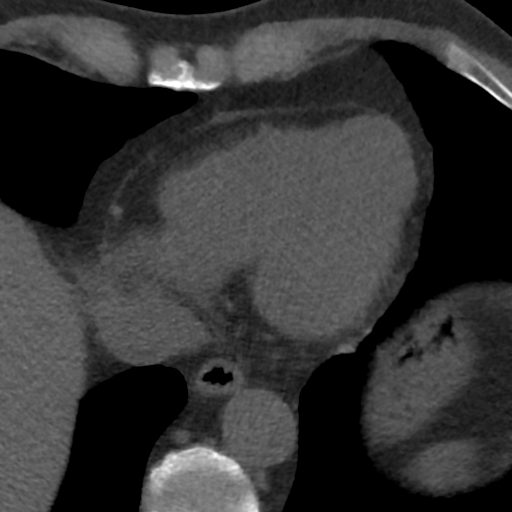
[im 9/44  lung]
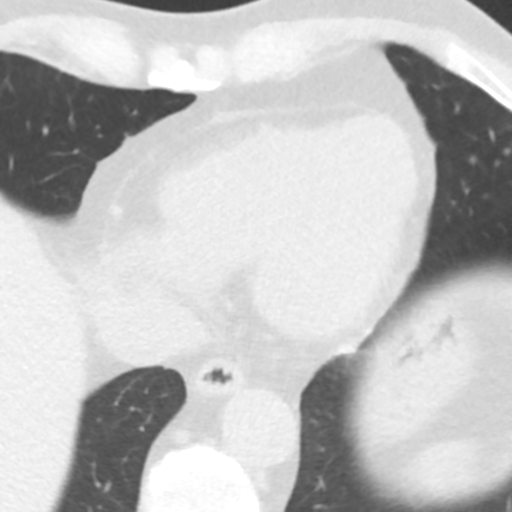
[im 18/44  vessel]
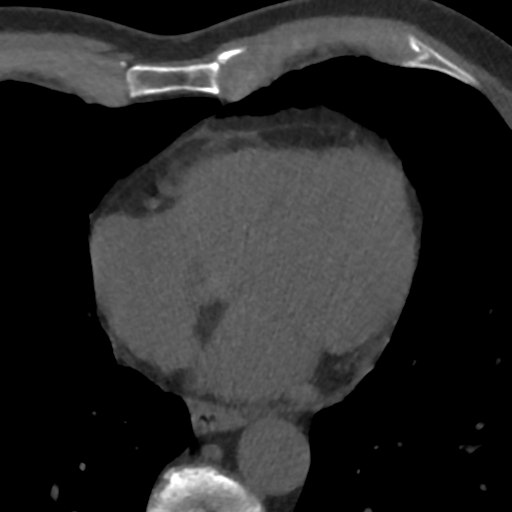
[im 26/44  vessel]
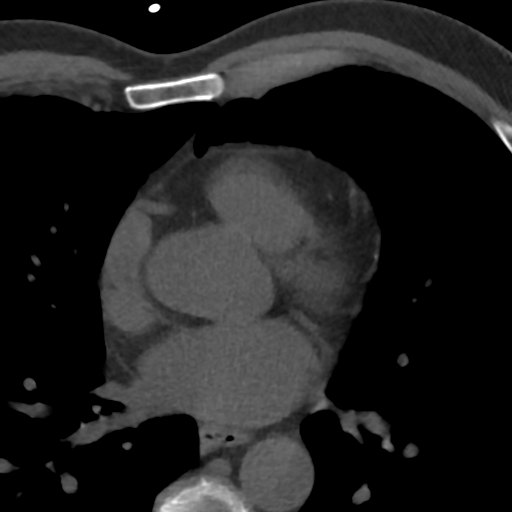
[im 35/44  vessel]
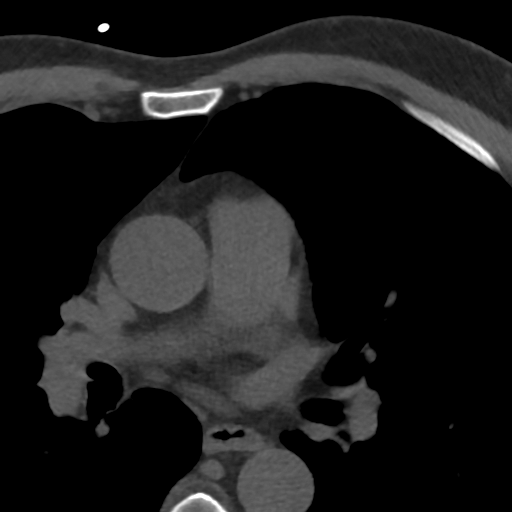

[Series 3: lung 33 % · axial · 0.70mm/px · z∈[-214,-130]mm · 5 of 44 slices shown]
[im 8/44  lung]
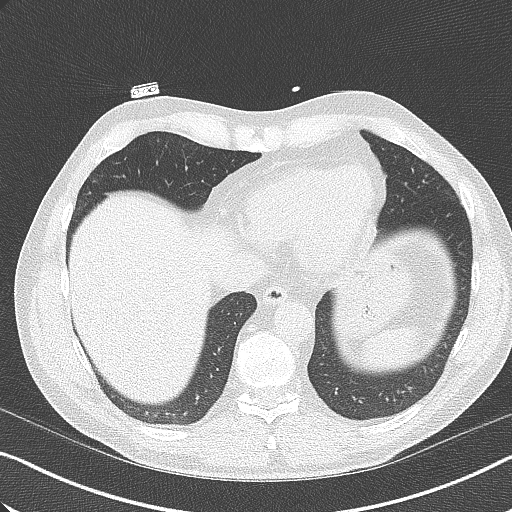
[im 15/44  lung]
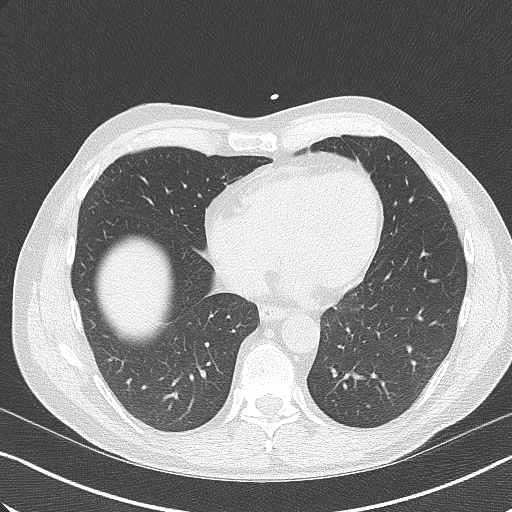
[im 22/44  lung]
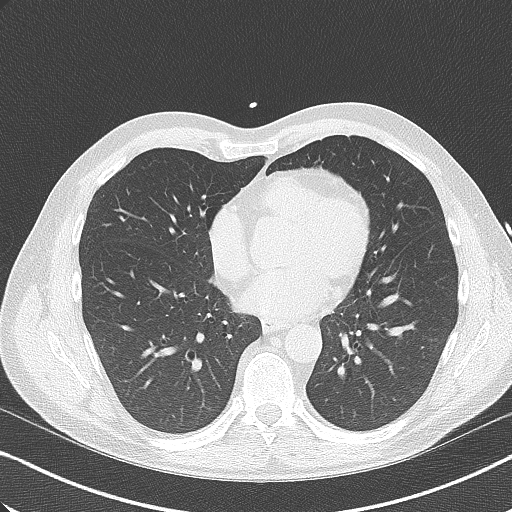
[im 29/44  lung]
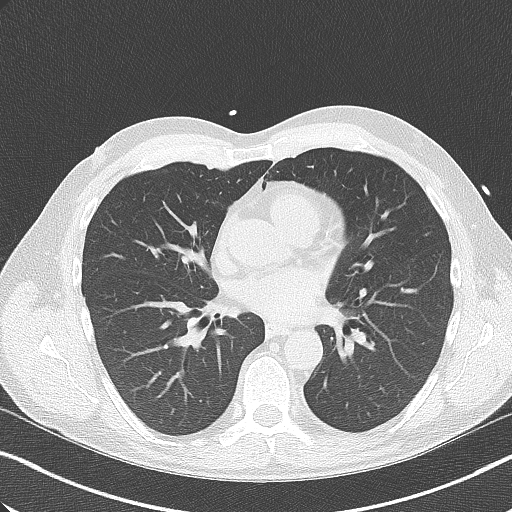
[im 36/44  lung]
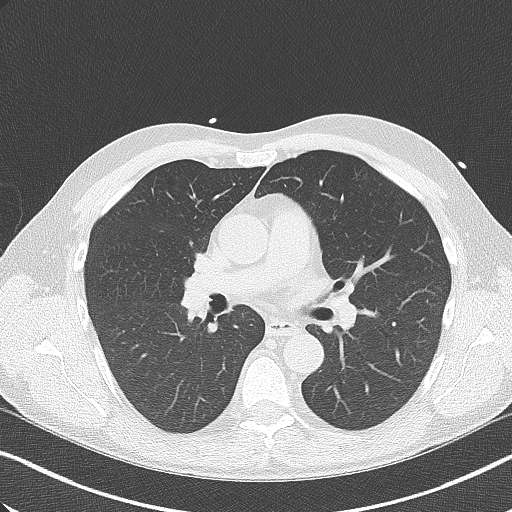

[Series 4: lung st 33 % · axial · 0.70mm/px · z∈[-214,-130]mm · 5 of 44 slices shown]
[im 8/44  lung]
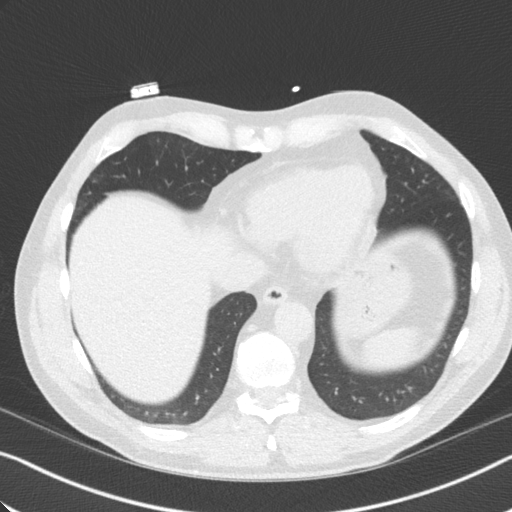
[im 15/44  lung]
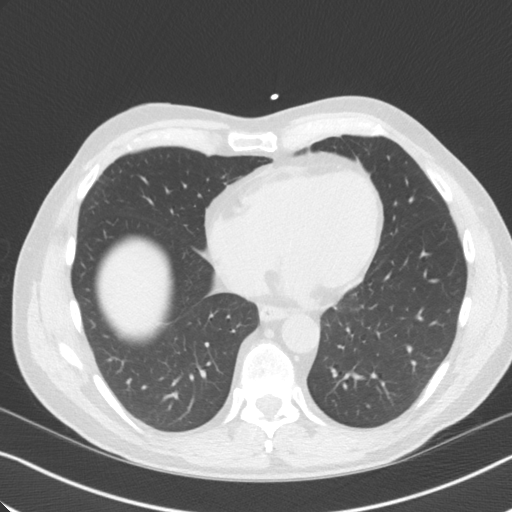
[im 22/44  lung]
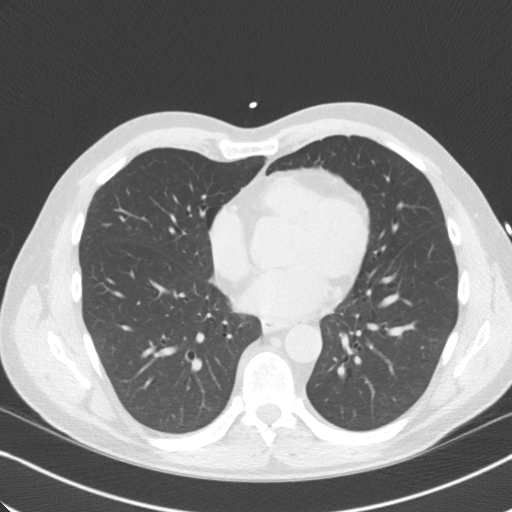
[im 29/44  lung]
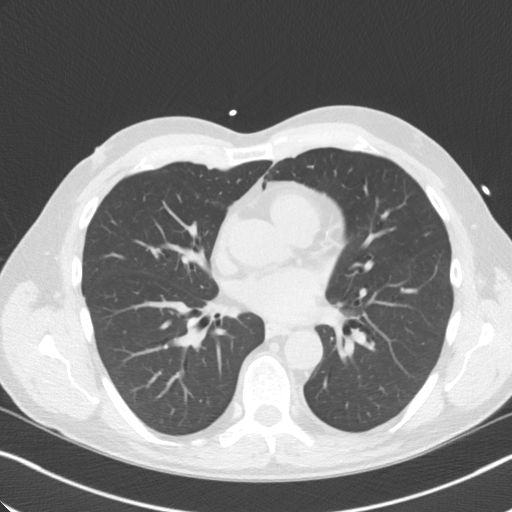
[im 36/44  lung]
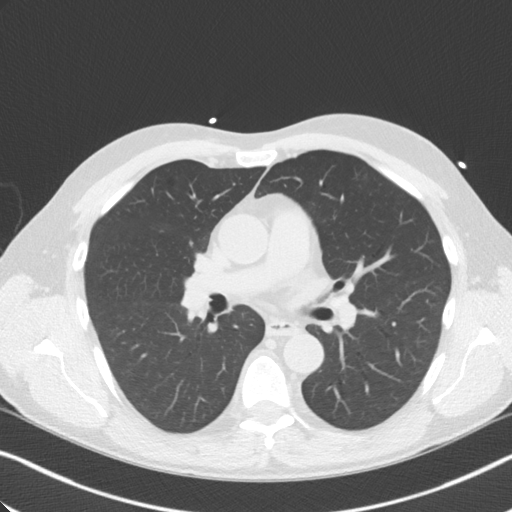

[14 of 20 positions shown; findings below may reference images not displayed]

FINDINGS: Vascular: Heart is normal size.  Aorta normal caliber.

Mediastinum/Nodes: No adenopathy.

Lungs/Pleura: No effusions. Small calcified granuloma in the right
middle lobe. Otherwise lungs clear.

Upper Abdomen: No acute findings.

Musculoskeletal: Chest wall soft tissues are unremarkable. No acute
bony abnormality.
IMPRESSION: No acute or significant extracardiac abnormality.
FINDINGS: Non-cardiac: See separate report from [REDACTED].

Ascending Aorta: Normal size, no atherosclerosis

Pericardium: Normal

Coronary arteries: Normal origins.  LAD calcification.
IMPRESSION: 1. Coronary calcium score of 13. This was 28 percentile for age and
sex matched control. Mid LAD calcification.

*** End of Addendum ***
EXAM:
OVER-READ INTERPRETATION  CT CHEST

The following report is an over-read performed by radiologist Dr.
Truman Olszewski [REDACTED] on 11/25/2019. This over-read
does not include interpretation of cardiac or coronary anatomy or
pathology. The coronary calcium score interpretation by the
cardiologist is attached.
FINDINGS: Vascular: Heart is normal size.  Aorta normal caliber.

Mediastinum/Nodes: No adenopathy.

Lungs/Pleura: No effusions. Small calcified granuloma in the right
middle lobe. Otherwise lungs clear.

Upper Abdomen: No acute findings.

Musculoskeletal: Chest wall soft tissues are unremarkable. No acute
bony abnormality.
IMPRESSION: No acute or significant extracardiac abnormality.

## 2022-04-27 ENCOUNTER — Ambulatory Visit (INDEPENDENT_AMBULATORY_CARE_PROVIDER_SITE_OTHER): Payer: Federal, State, Local not specified - PPO

## 2022-04-27 DIAGNOSIS — J309 Allergic rhinitis, unspecified: Secondary | ICD-10-CM | POA: Diagnosis not present

## 2022-05-03 ENCOUNTER — Other Ambulatory Visit: Payer: Self-pay | Admitting: *Deleted

## 2022-05-03 MED ORDER — FAMOTIDINE 40 MG PO TABS: 40.0000 mg | ORAL_TABLET | Freq: Every day | ORAL | 3 refills | Status: DC

## 2022-05-27 ENCOUNTER — Ambulatory Visit (INDEPENDENT_AMBULATORY_CARE_PROVIDER_SITE_OTHER): Payer: Federal, State, Local not specified - PPO

## 2022-05-27 DIAGNOSIS — J309 Allergic rhinitis, unspecified: Secondary | ICD-10-CM

## 2022-06-22 ENCOUNTER — Ambulatory Visit (INDEPENDENT_AMBULATORY_CARE_PROVIDER_SITE_OTHER): Payer: Federal, State, Local not specified - PPO

## 2022-06-22 DIAGNOSIS — J309 Allergic rhinitis, unspecified: Secondary | ICD-10-CM | POA: Diagnosis not present

## 2022-08-17 ENCOUNTER — Ambulatory Visit (INDEPENDENT_AMBULATORY_CARE_PROVIDER_SITE_OTHER): Payer: Federal, State, Local not specified - PPO

## 2022-08-17 DIAGNOSIS — J309 Allergic rhinitis, unspecified: Secondary | ICD-10-CM

## 2022-08-25 ENCOUNTER — Other Ambulatory Visit: Payer: Self-pay | Admitting: *Deleted

## 2022-08-25 MED ORDER — RYALTRIS 665-25 MCG/ACT NA SUSP: NASAL | 5 refills | Status: DC

## 2022-09-14 ENCOUNTER — Ambulatory Visit (INDEPENDENT_AMBULATORY_CARE_PROVIDER_SITE_OTHER): Payer: Federal, State, Local not specified - PPO

## 2022-09-14 DIAGNOSIS — J309 Allergic rhinitis, unspecified: Secondary | ICD-10-CM | POA: Diagnosis not present

## 2022-09-21 ENCOUNTER — Ambulatory Visit (INDEPENDENT_AMBULATORY_CARE_PROVIDER_SITE_OTHER): Payer: Federal, State, Local not specified - PPO

## 2022-09-21 DIAGNOSIS — J309 Allergic rhinitis, unspecified: Secondary | ICD-10-CM | POA: Diagnosis not present

## 2022-09-28 ENCOUNTER — Ambulatory Visit (INDEPENDENT_AMBULATORY_CARE_PROVIDER_SITE_OTHER): Payer: Federal, State, Local not specified - PPO

## 2022-09-28 DIAGNOSIS — J309 Allergic rhinitis, unspecified: Secondary | ICD-10-CM | POA: Diagnosis not present

## 2022-10-03 ENCOUNTER — Ambulatory Visit: Payer: Federal, State, Local not specified - PPO | Admitting: Family

## 2022-10-05 ENCOUNTER — Ambulatory Visit (INDEPENDENT_AMBULATORY_CARE_PROVIDER_SITE_OTHER): Payer: Federal, State, Local not specified - PPO

## 2022-10-05 DIAGNOSIS — J309 Allergic rhinitis, unspecified: Secondary | ICD-10-CM | POA: Diagnosis not present

## 2022-10-10 ENCOUNTER — Ambulatory Visit: Payer: Federal, State, Local not specified - PPO | Admitting: Family

## 2022-10-12 ENCOUNTER — Ambulatory Visit (INDEPENDENT_AMBULATORY_CARE_PROVIDER_SITE_OTHER): Payer: Federal, State, Local not specified - PPO

## 2022-10-12 DIAGNOSIS — J309 Allergic rhinitis, unspecified: Secondary | ICD-10-CM | POA: Diagnosis not present

## 2022-11-01 ENCOUNTER — Ambulatory Visit: Payer: Federal, State, Local not specified - PPO | Admitting: Allergy and Immunology

## 2022-11-09 ENCOUNTER — Ambulatory Visit (INDEPENDENT_AMBULATORY_CARE_PROVIDER_SITE_OTHER): Payer: Federal, State, Local not specified - PPO

## 2022-11-09 DIAGNOSIS — J309 Allergic rhinitis, unspecified: Secondary | ICD-10-CM

## 2022-11-24 DIAGNOSIS — J3089 Other allergic rhinitis: Secondary | ICD-10-CM | POA: Diagnosis not present

## 2022-11-24 NOTE — Progress Notes (Signed)
VIALS EXP 11-24-23

## 2022-12-02 ENCOUNTER — Other Ambulatory Visit: Payer: Self-pay | Admitting: Allergy and Immunology

## 2022-12-02 ENCOUNTER — Other Ambulatory Visit: Payer: Self-pay | Admitting: Family

## 2022-12-06 ENCOUNTER — Other Ambulatory Visit: Payer: Self-pay

## 2022-12-06 ENCOUNTER — Ambulatory Visit: Payer: Federal, State, Local not specified - PPO | Admitting: Allergy and Immunology

## 2022-12-06 VITALS — BP 122/78 | HR 91 | Temp 99.1°F | Resp 18 | Ht 68.7 in | Wt 158.6 lb

## 2022-12-06 DIAGNOSIS — J4540 Moderate persistent asthma, uncomplicated: Secondary | ICD-10-CM | POA: Diagnosis not present

## 2022-12-06 DIAGNOSIS — K219 Gastro-esophageal reflux disease without esophagitis: Secondary | ICD-10-CM | POA: Diagnosis not present

## 2022-12-06 DIAGNOSIS — J3089 Other allergic rhinitis: Secondary | ICD-10-CM

## 2022-12-06 MED ORDER — OMEPRAZOLE 20 MG PO CPDR: 20.0000 mg | DELAYED_RELEASE_CAPSULE | Freq: Every morning | ORAL | 1 refills | Status: DC

## 2022-12-06 MED ORDER — ALBUTEROL SULFATE HFA 108 (90 BASE) MCG/ACT IN AERS: 2.0000 | INHALATION_SPRAY | Freq: Four times a day (QID) | RESPIRATORY_TRACT | 1 refills | Status: DC | PRN

## 2022-12-06 MED ORDER — BUDESONIDE 32 MCG/ACT NA SUSP: 1.0000 | Freq: Every day | NASAL | 3 refills | Status: DC

## 2022-12-06 MED ORDER — RYALTRIS 665-25 MCG/ACT NA SUSP: NASAL | 5 refills | Status: DC

## 2022-12-06 MED ORDER — BUDESONIDE-FORMOTEROL FUMARATE 160-4.5 MCG/ACT IN AERO: 2.0000 | INHALATION_SPRAY | Freq: Two times a day (BID) | RESPIRATORY_TRACT | 1 refills | Status: DC

## 2022-12-06 MED ORDER — EPINEPHRINE 0.3 MG/0.3ML IJ SOAJ: 0.3000 mg | INTRAMUSCULAR | 1 refills | Status: DC | PRN

## 2022-12-06 MED ORDER — OMEPRAZOLE 20 MG PO CPDR: 20.0000 mg | DELAYED_RELEASE_CAPSULE | Freq: Two times a day (BID) | ORAL | 1 refills | Status: DC

## 2022-12-06 MED ORDER — FAMOTIDINE 40 MG PO TABS: 40.0000 mg | ORAL_TABLET | Freq: Every evening | ORAL | 1 refills | Status: DC

## 2022-12-06 MED ORDER — MONTELUKAST SODIUM 10 MG PO TABS: 10.0000 mg | ORAL_TABLET | Freq: Every evening | ORAL | 1 refills | Status: DC

## 2022-12-06 NOTE — Patient Instructions (Addendum)
  1. Continue to Treat and prevent inflammation:   A. Symbicort 160 - 2 inhalations 1-2 times per day w/spacer.    B. Ryaltris - 2 sprays each nostril 1-2 times per day  C. Montelukast 10 - 1 tablet 1 time per day  D. Immunotherapy. (Epipen)   2. Continue to Treat and prevent reflux:   A. Increase Omeprazole 40 mg - 2 times per day  B. famotidine 40 mg in PM   3.  Continue CPAP for treatment of sleep apnea  4. If needed:   A. ProAir HFA 2 puffs every 4-6 hours   B. nasal saline spray  C. OTC antihistamine  5. Avoid menthol cough drops  6. Return to clinic in 1 month or earlier if problem  7. Plan for fall flu vaccine

## 2022-12-06 NOTE — Progress Notes (Unsigned)
[place] [facility] [place] - [place] - [place] - [place]   Follow-up Note  Referring Provider: Gareth Morgan, MD Primary Provider: Gareth Morgan, MD Date of Office Visit: 12/06/2022  Subjective:   Kevin [name] (DOB: 06/22/1955) is a 67 y.o. male who [facility] in re-evaluation of the following:  HPI: Kevin [name] returns to this clinic in evaluation of asthma, allergic rhinitis, LPR, sleep apnea.  I last saw him in this clinic 02 March 2021 and he was seen by our nurse practitioner 04 April 2022.  He believes that he has had more problems with his respiratory tract since April 2024.  This is especially true whenever he is in an environment that is very hot and muggy.  He is developing throat clearing and mucus in his throat and he has to wake up in the morning and cough to clear out his airway.  He has been using lots of Halls menthol cough drops to help with this issue.  He does not believe that he has been having any active reflux at this point.  He does not believe that he has been having much problems with his upper airways at this point.  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  He does not have any anosmia or ugly nasal discharge or ugly sputum production or chest pain or other respiratory tract symptoms.  Allergies as of 12/06/2022   No Known Allergies      Medication List    albuterol 108 (90 Base) MCG/ACT inhaler Commonly known as: VENTOLIN HFA Inhale 2 puffs into the lungs every 6 (six) hours as needed.   budesonide 32 MCG/ACT nasal spray Commonly known as: RHINOCORT AQUA Place 1 spray into both nostrils daily.   budesonide-formoterol 160-4.5 MCG/ACT inhaler Commonly known as: SYMBICORT Inhale 2 puffs into the lungs in the morning and at bedtime.   EPINEPHrine 0.3 mg/0.3 mL Soaj injection Commonly  known as: EPI-PEN Inject 0.3 mg into the muscle as needed for anaphylaxis.   famotidine 40 MG tablet Commonly known as: PEPCID Take 1 tablet (40 mg total) by mouth at bedtime.   Methylcellulose (Laxative) 500 MG Tabs Take 2 tablets by mouth daily.   montelukast 10 MG tablet Commonly known as: SINGULAIR TAKE 1 TABLET(10 MG) BY MOUTH AT BEDTIME   omeprazole 20 MG capsule Commonly known as: PRILOSEC Take 1 capsule (20 mg total) by mouth daily.   OSTEO BI-FLEX ADV JOINT SHIELD PO Take 1 tablet by mouth daily.   PRESCRIPTION MEDICATION ALLERGY VACCINE -- Use as directed weekly.   Ryaltris 295-62 MCG/ACT Susp Generic drug: Olopatadine-Mometasone Place 1 to 2 sprays in each nostril twice a day for runny nose/drainage down throat/stuffy nose   simvastatin 20 MG tablet Commonly known as: ZOCOR Take 1 tablet (20 mg total) by mouth at bedtime.    Past Medical History:  Diagnosis Date   ALLERGIC RHINITIS    Asthma    Dyslipidemia    Family history of heart disease    Hepatitis    3rd Grade   History of insomnia    History of nuclear stress test 08/2007    negative bruce myoview   History of palpitations     Past Surgical History:  Procedure Laterality Date   COLONOSCOPY N/A 07/12/2017   Procedure: COLONOSCOPY;  Surgeon: Corbin Ade,  MD;  [facility];  Service: Endoscopy;  Laterality: N/A;   NASAL SEPTOPLASTY W/ TURBINOPLASTY  2000   deviated septum   POLYPECTOMY  07/12/2017   Procedure: POLYPECTOMY;  Surgeon: Corbin Ade, MD;  [facility];  Service: Endoscopy;;  recto-sigmoid colon   Sleep Study  07/11/2005   AHI during total sleep 3.68/hr, during REM sleep 5.52/hr   TONSILLECTOMY     TRANSTHORACIC ECHOCARDIOGRAM  09/13/2007   EF=>55%, borderline asymmetric LVH; mild MR/TR, normal RVSP; AV mildly sclerotic, trace AV regurg & trave pulm vavle regurg   varicocoel      Review of systems negative except as noted in HPI / PMHx or noted  below:  Review of Systems  Constitutional: Negative.   HENT: Negative.    Eyes: Negative.   Respiratory: Negative.    Cardiovascular: Negative.   Gastrointestinal: Negative.   Genitourinary: Negative.   Musculoskeletal: Negative.   Skin: Negative.   Neurological: Negative.   Endo/Heme/Allergies: Negative.   Psychiatric/Behavioral: Negative.       Objective:   Vitals:   12/06/22 1011  BP: 122/78  Pulse: 91  Resp: 18  Temp: 99.1 F (37.3 C)  SpO2: 99%   Height: 5' 8.7" (174.5 cm)  Weight: 158 lb 9.6 oz (71.9 kg)   Physical Exam Constitutional:      Appearance: He is not diaphoretic.  HENT:     Head: Normocephalic.     Right Ear: Tympanic membrane, ear canal and external ear normal.     Left Ear: Tympanic membrane, ear canal and external ear normal.     Nose: Nose normal. No mucosal edema or rhinorrhea.     Mouth/Throat:     Pharynx: Uvula midline. No oropharyngeal exudate.  Eyes:     Conjunctiva/sclera: Conjunctivae normal.  Neck:     Thyroid: No thyromegaly.     Trachea: Trachea normal. No tracheal tenderness or tracheal deviation.  Cardiovascular:     Rate and Rhythm: Normal rate and regular rhythm.     Heart sounds: Normal heart sounds, S1 normal and S2 normal. No murmur heard. Pulmonary:     Effort: No respiratory distress.     Breath sounds: Normal breath sounds. No stridor. No wheezing or rales.  Lymphadenopathy:     Head:     Right side of head: No tonsillar adenopathy.     Left side of head: No tonsillar adenopathy.     Cervical: No cervical adenopathy.  Skin:    Findings: No erythema or rash.     Nails: There is no clubbing.  Neurological:     Mental Status: He is alert.     Diagnostics:    Spirometry was performed and demonstrated an FEV1 of 4.20 at 138 % of predicted.  Assessment and Plan:   No diagnosis found.   1. Continue to Treat and prevent inflammation:   A. Symbicort 160 - 2 inhalations 1-2 times per day w/spacer.    B.  Ryaltris - 2 sprays each nostril 1-2 times per day  C. Montelukast 10 - 1 tablet 1 time per day  D. Immunotherapy. (Epipen)   2. Continue to Treat and prevent reflux:   A. Increase Omeprazole 40 mg - 2 times per day  B. famotidine 40 mg in PM   3.  Continue CPAP for treatment of sleep apnea  4. If needed:   A. ProAir HFA 2 puffs every 4-6 hours   B. nasal saline spray  C. OTC antihistamine  5. Avoid menthol cough drops  6. Return to clinic in 1 month or earlier if problem  7. Plan for fall flu vaccine  Whyatt is really doing very well regarding his asthma with a supernormal FEV1 on his current plan of using Symbicort.  And his nose appears to be doing pretty well while using some combination nasal steroid/antihistamine and immunotherapy.  But he has a lot of throat clearing and a lot of mucus in his throat and this may be representative of his LPR and his use of menthol cough drops.  We are going to increase his therapy for LPR over the next month and have him discontinue menthol cough drops and will consider further evaluation and treatment based upon his response in 1 month.  Laurette Schimke, MD Allergy / Immunology [facility]

## 2022-12-07 ENCOUNTER — Ambulatory Visit (INDEPENDENT_AMBULATORY_CARE_PROVIDER_SITE_OTHER): Payer: Federal, State, Local not specified - PPO

## 2022-12-07 ENCOUNTER — Encounter: Payer: Self-pay | Admitting: Allergy and Immunology

## 2022-12-07 DIAGNOSIS — J309 Allergic rhinitis, unspecified: Secondary | ICD-10-CM | POA: Diagnosis not present

## 2023-01-04 ENCOUNTER — Ambulatory Visit (INDEPENDENT_AMBULATORY_CARE_PROVIDER_SITE_OTHER): Payer: Self-pay

## 2023-01-04 DIAGNOSIS — J309 Allergic rhinitis, unspecified: Secondary | ICD-10-CM | POA: Diagnosis not present

## 2023-01-10 ENCOUNTER — Encounter: Payer: Self-pay | Admitting: Allergy and Immunology

## 2023-01-10 ENCOUNTER — Other Ambulatory Visit: Payer: Self-pay

## 2023-01-10 ENCOUNTER — Ambulatory Visit: Payer: Federal, State, Local not specified - PPO | Admitting: Allergy and Immunology

## 2023-01-10 VITALS — BP 110/78 | HR 104 | Temp 98.6°F | Resp 16

## 2023-01-10 DIAGNOSIS — K219 Gastro-esophageal reflux disease without esophagitis: Secondary | ICD-10-CM

## 2023-01-10 DIAGNOSIS — J4540 Moderate persistent asthma, uncomplicated: Secondary | ICD-10-CM | POA: Diagnosis not present

## 2023-01-10 DIAGNOSIS — J3089 Other allergic rhinitis: Secondary | ICD-10-CM

## 2023-01-10 DIAGNOSIS — G4733 Obstructive sleep apnea (adult) (pediatric): Secondary | ICD-10-CM

## 2023-01-10 MED ORDER — EPINEPHRINE 0.3 MG/0.3ML IJ SOAJ: 0.3000 mg | INTRAMUSCULAR | 1 refills | Status: DC | PRN

## 2023-01-10 MED ORDER — ALBUTEROL SULFATE HFA 108 (90 BASE) MCG/ACT IN AERS: 2.0000 | INHALATION_SPRAY | Freq: Four times a day (QID) | RESPIRATORY_TRACT | 1 refills | Status: DC | PRN

## 2023-01-10 MED ORDER — LEVOCETIRIZINE DIHYDROCHLORIDE 5 MG PO TABS: 5.0000 mg | ORAL_TABLET | Freq: Every day | ORAL | 1 refills | Status: DC | PRN

## 2023-01-10 MED ORDER — RYALTRIS 665-25 MCG/ACT NA SUSP: NASAL | 5 refills | Status: DC

## 2023-01-10 MED ORDER — BUDESONIDE-FORMOTEROL FUMARATE 160-4.5 MCG/ACT IN AERO: 2.0000 | INHALATION_SPRAY | Freq: Two times a day (BID) | RESPIRATORY_TRACT | 1 refills | Status: DC

## 2023-01-10 MED ORDER — OMEPRAZOLE 40 MG PO CPDR: 40.0000 mg | DELAYED_RELEASE_CAPSULE | Freq: Two times a day (BID) | ORAL | 5 refills | Status: DC

## 2023-01-10 MED ORDER — MONTELUKAST SODIUM 10 MG PO TABS: 10.0000 mg | ORAL_TABLET | Freq: Every evening | ORAL | 1 refills | Status: DC

## 2023-01-10 MED ORDER — OMEPRAZOLE 20 MG PO CPDR: 20.0000 mg | DELAYED_RELEASE_CAPSULE | Freq: Two times a day (BID) | ORAL | 1 refills | Status: DC

## 2023-01-10 MED ORDER — FAMOTIDINE 40 MG PO TABS: 40.0000 mg | ORAL_TABLET | Freq: Every evening | ORAL | 1 refills | Status: DC

## 2023-01-10 NOTE — Progress Notes (Unsigned)
[place] - [place] - [place] - [place] - [place]   Follow-up Note  Referring Provider: Gareth Morgan, MD Primary Provider: Gareth Morgan, MD Date of Office Visit: 01/10/2023  Subjective:   Kevin [name] (DOB: February 04, 1956) is a 67 y.o. male who [facility]23 in re-evaluation of the following:  HPI: Kellin returns to this clinic in evaluation of asthma, allergic rhinitis, LPR, sleep apnea.  I last saw him in this clinic 06 December 2022.  When he was last seen in this clinic he had developed a lot of throat clearing and mucus in his throat and he had to wake up in the morning and cough to clear out his mid airway and he was using lots of Halls menthol cough drops.  He believes that he was doing better with less cough and less throat issues while using his omeprazole at 20 mg twice a day.  However, he and his entire family contracted a viral respiratory tract infection a few weeks ago and he had a lot of nasal congestion and sneezing and some cough but he is much better today and he thinks that for the most part all of that has resolved.   Allergies as of 01/10/2023   No Known Allergies      Medication List    albuterol 108 (90 Base) MCG/ACT inhaler Commonly known as: VENTOLIN HFA Inhale 2 puffs into the lungs every 6 (six) hours as needed.   budesonide 32 MCG/ACT nasal spray Commonly known as: RHINOCORT AQUA Place 1 spray into both nostrils daily.   budesonide-formoterol 160-4.5 MCG/ACT inhaler Commonly known as: SYMBICORT Inhale 2 puffs into the lungs in the morning and at bedtime.   EPINEPHrine 0.3 mg/0.3 mL Soaj injection Commonly known as: EPI-PEN Inject 0.3 mg into the muscle as needed for anaphylaxis.   famotidine 40 MG tablet Commonly known as: PEPCID Take 1 tablet (40 mg total) by mouth at bedtime.   levocetirizine 5 MG tablet Commonly known as: XYZAL Take 1 tablet (5 mg total) by mouth daily as needed for  allergies (Can take an extar dose during flare ups.). Started by: Maison Agrusa J Isela Stantz   lisinopril 20 MG tablet Commonly known as: ZESTRIL Take 20 mg by mouth daily.   Methylcellulose (Laxative) 500 MG Tabs Take 2 tablets by mouth daily.   montelukast 10 MG tablet Commonly known as: SINGULAIR Take 1 tablet (10 mg total) by mouth at bedtime. TAKE 1 TABLET(10 MG) BY MOUTH AT BEDTIME   omeprazole 40 MG capsule Commonly known as: PRILOSEC Take 1 capsule (40 mg total) by mouth in the morning and at bedtime. What changed:  medication strength how much to take Changed by: Yanai Hobson J Sharod Petsch   OSTEO BI-FLEX ADV JOINT SHIELD PO Take 1 tablet by mouth daily.   PRESCRIPTION MEDICATION ALLERGY VACCINE -- Use as directed weekly.   Ryaltris 161-09 MCG/ACT Susp Generic drug: Olopatadine-Mometasone Place 1 to 2 sprays in each nostril twice a day for runny nose/drainage down throat/stuffy nose   simvastatin 20 MG tablet Commonly known as: ZOCOR Take 1 tablet (20 mg total) by mouth at bedtime.     Past Medical History:  Diagnosis Date   ALLERGIC RHINITIS    Asthma    Dyslipidemia    Family history of heart disease    Hepatitis    3rd Grade   History of insomnia    History of nuclear stress test 08/2007    negative bruce myoview  History of palpitations     Past Surgical History:  Procedure Laterality Date   COLONOSCOPY N/A 07/12/2017   Procedure: COLONOSCOPY;  Surgeon: Corbin Ade, MD;  [facility];  Service: Endoscopy;  Laterality: N/A;   NASAL SEPTOPLASTY W/ TURBINOPLASTY  2000   deviated septum   POLYPECTOMY  07/12/2017   Procedure: POLYPECTOMY;  Surgeon: Corbin Ade, MD;  [facility];  Service: Endoscopy;;  recto-sigmoid colon   Sleep Study  07/11/2005   AHI during total sleep 3.68/hr, during REM sleep 5.52/hr   TONSILLECTOMY     TRANSTHORACIC ECHOCARDIOGRAM  09/13/2007   EF=>55%, borderline asymmetric LVH; mild MR/TR, normal RVSP; AV mildly  sclerotic, trace AV regurg & trave pulm vavle regurg   varicocoel      Review of systems negative except as noted in HPI / PMHx or noted below:  Review of Systems  Constitutional: Negative.   HENT: Negative.    Eyes: Negative.   Respiratory: Negative.    Cardiovascular: Negative.   Gastrointestinal: Negative.   Genitourinary: Negative.   Musculoskeletal: Negative.   Skin: Negative.   Neurological: Negative.   Endo/Heme/Allergies: Negative.   Psychiatric/Behavioral: Negative.       Objective:   Vitals:   01/10/23 0929  BP: 110/78  Pulse: (!) 104  Resp: 16  Temp: 98.6 F (37 C)  SpO2: 99%          Physical Exam Constitutional:      Appearance: He is not diaphoretic.  HENT:     Head: Normocephalic.     Right Ear: Tympanic membrane, ear canal and external ear normal.     Left Ear: Tympanic membrane, ear canal and external ear normal.     Nose: Nose normal. No mucosal edema or rhinorrhea.     Mouth/Throat:     Pharynx: Uvula midline. No oropharyngeal exudate.  Eyes:     Conjunctiva/sclera: Conjunctivae normal.  Neck:     Thyroid: No thyromegaly.     Trachea: Trachea normal. No tracheal tenderness or tracheal deviation.  Cardiovascular:     Rate and Rhythm: Normal rate and regular rhythm.     Heart sounds: Normal heart sounds, S1 normal and S2 normal. No murmur heard. Pulmonary:     Effort: No respiratory distress.     Breath sounds: Normal breath sounds. No stridor. No wheezing or rales.  Lymphadenopathy:     Head:     Right side of head: No tonsillar adenopathy.     Left side of head: No tonsillar adenopathy.     Cervical: No cervical adenopathy.  Skin:    Findings: No erythema or rash.     Nails: There is no clubbing.  Neurological:     Mental Status: He is alert.     Diagnostics: The patient had an Asthma Control Test with the following results: ACT Total Score: 21.    Assessment and Plan:   1. Asthma, moderate persistent, well-controlled   2.  Perennial allergic rhinitis   3. LPRD (laryngopharyngeal reflux disease)   4. Obstructive sleep apnea syndrome    1. Continue to Treat and prevent inflammation:   A. Symbicort 160 - 2 inhalations 1-2 times per day w/spacer.    B. Ryaltris - 2 sprays each nostril 1-2 times per day  C. Montelukast 10 - 1 tablet 1 time per day  D. Immunotherapy. (Epipen)   2. Continue to Treat and prevent reflux:   A. Increase Omeprazole 40 mg - 2 times per  day  B. famotidine 40 mg in PM   3.  Continue CPAP for treatment of sleep apnea  4. If needed:   A. Albuterol HFA 2 puffs every 4-6 hours   B. nasal saline spray  C. OTC antihistamine  5. Return to clinic in November or earlier if problem. Taper medications???  6. Plan for fall flu vaccine  Eliud appears to be doing better and I would like for him to stay on relatively high-dose omeprazole for an additional 8 weeks so we can completely clear out what appeared to be some mid airway irritation from his reflux disease and I will regroup with him at that point in time and they should be an opportunity to consolidate his treatment regarding both his omeprazole and hopefully his Symbicort and right Ultracin at that point.  Laurette Schimke, MD Allergy / Immunology [facility]

## 2023-01-10 NOTE — Patient Instructions (Addendum)
  1. Continue to Treat and prevent inflammation:   A. Symbicort 160 - 2 inhalations 1-2 times per day w/spacer.    B. Ryaltris - 2 sprays each nostril 1-2 times per day  C. Montelukast 10 - 1 tablet 1 time per day  D. Immunotherapy. (Epipen)   2. Continue to Treat and prevent reflux:   A. Increase Omeprazole 40 mg - 2 times per day  B. famotidine 40 mg in PM   3.  Continue CPAP for treatment of sleep apnea  4. If needed:   A. Albuterol HFA 2 puffs every 4-6 hours   B. nasal saline spray  C. OTC antihistamine  5. Return to clinic in November or earlier if problem. Taper medications???  6. Plan for fall flu vaccine

## 2023-01-11 ENCOUNTER — Encounter: Payer: Self-pay | Admitting: Allergy and Immunology

## 2023-02-01 ENCOUNTER — Ambulatory Visit (INDEPENDENT_AMBULATORY_CARE_PROVIDER_SITE_OTHER): Payer: Federal, State, Local not specified - PPO

## 2023-02-01 DIAGNOSIS — J309 Allergic rhinitis, unspecified: Secondary | ICD-10-CM

## 2023-03-01 ENCOUNTER — Ambulatory Visit (INDEPENDENT_AMBULATORY_CARE_PROVIDER_SITE_OTHER): Payer: Federal, State, Local not specified - PPO

## 2023-03-01 DIAGNOSIS — J309 Allergic rhinitis, unspecified: Secondary | ICD-10-CM | POA: Diagnosis not present

## 2023-03-04 NOTE — Progress Notes (Unsigned)
  Cardiology Office Note:  .   Date:  03/04/2023  ID:  Kevin [name], DOB Apr 07, 1956, MRN [id number] PCP: Gareth Morgan, MD  [facility] Providers Cardiologist:  Chrystie Nose, MD  History of Present Illness: .   Kevin [name] is a 67 y.o. male with a history of asthma, HLD, family history of CAD. Patient is followed by Dr. Rennis Golden and presents today for an annual follow up appointment   Patient previously underwent nuclear stress test in 2009 that was a normal, low risk study without evidence of ischemia.  More recently, underwent CT calcium scoring on 11/25/2019 that showed a coronary calcium score of 13 (28th percentile.  Has been on simvastatin for cholesterol management.  Family history of premature coronary disease   Dyslipidemia    ROS: ***  Studies Reviewed: .        *** Risk Assessment/Calculations:   {Does this patient have ATRIAL FIBRILLATION?[phone]} No BP recorded.  {Refresh Note OR Click here to enter BP  :1}***       Physical Exam:   VS:  There were no vitals taken for this visit.   Wt Readings from Last 3 Encounters:  12/06/22 158 lb 9.6 oz (71.9 kg)  03/07/22 187 lb (84.8 kg)  03/02/21 184 lb 3.2 oz (83.6 kg)    GEN: Well nourished, well developed in no acute distress NECK: No JVD; No carotid bruits CARDIAC: ***RRR, no murmurs, rubs, gallops RESPIRATORY:  Clear to auscultation without rales, wheezing or rhonchi  ABDOMEN: Soft, non-tender, non-distended EXTREMITIES:  No edema; No deformity   ASSESSMENT AND PLAN: .   ***    {Are you ordering a CV Procedure (e.g. stress test, cath, DCCV, TEE, etc)?   Press F2        :[id number]}  Dispo: ***  Signed, Jonita Albee, PA-C

## 2023-03-06 ENCOUNTER — Encounter: Payer: Self-pay | Admitting: Cardiology

## 2023-03-06 ENCOUNTER — Ambulatory Visit: Payer: Federal, State, Local not specified - PPO | Attending: Cardiology | Admitting: Cardiology

## 2023-03-06 VITALS — BP 118/80 | HR 90 | Ht 69.0 in | Wt 184.0 lb

## 2023-03-06 DIAGNOSIS — Z8249 Family history of ischemic heart disease and other diseases of the circulatory system: Secondary | ICD-10-CM

## 2023-03-06 DIAGNOSIS — I10 Essential (primary) hypertension: Secondary | ICD-10-CM

## 2023-03-06 DIAGNOSIS — E782 Mixed hyperlipidemia: Secondary | ICD-10-CM | POA: Diagnosis not present

## 2023-03-06 NOTE — Patient Instructions (Signed)
Medication Instructions:  No changes *If you need a refill on your cardiac medications before your next appointment, please call your pharmacy*  Lab Work: No labs  Testing/Procedures: No testing   Follow-Up: At [facility], you and your health needs are our priority.  As part of our continuing mission to provide you with exceptional heart care, we have created designated Provider Care Teams.  These Care Teams include your primary Cardiologist (physician) and Advanced Practice Providers (APPs -  Physician Assistants and Nurse Practitioners) who all work together to provide you with the care you need, when you need it.  We recommend signing up for the patient portal called "MyChart".  Sign up information is provided on this After Visit Summary.  MyChart is used to connect with patients for Virtual Visits (Telemedicine).  Patients are able to view lab/test results, encounter notes, upcoming appointments, etc.  Non-urgent messages can be sent to your provider as well.   To learn more about what you can do with MyChart, go to [email or web address].    Your next appointment:   1 Years  Provider:   Chrystie Nose, MD

## 2023-03-14 ENCOUNTER — Other Ambulatory Visit: Payer: Self-pay

## 2023-03-14 ENCOUNTER — Ambulatory Visit: Payer: Federal, State, Local not specified - PPO | Admitting: Allergy and Immunology

## 2023-03-14 VITALS — BP 120/78 | HR 85 | Temp 98.3°F | Resp 16 | Ht 69.0 in | Wt 186.7 lb

## 2023-03-14 DIAGNOSIS — J3089 Other allergic rhinitis: Secondary | ICD-10-CM

## 2023-03-14 DIAGNOSIS — J301 Allergic rhinitis due to pollen: Secondary | ICD-10-CM | POA: Diagnosis not present

## 2023-03-14 DIAGNOSIS — K219 Gastro-esophageal reflux disease without esophagitis: Secondary | ICD-10-CM | POA: Diagnosis not present

## 2023-03-14 DIAGNOSIS — G4733 Obstructive sleep apnea (adult) (pediatric): Secondary | ICD-10-CM

## 2023-03-14 DIAGNOSIS — J4540 Moderate persistent asthma, uncomplicated: Secondary | ICD-10-CM

## 2023-03-14 MED ORDER — MONTELUKAST SODIUM 10 MG PO TABS: 10.0000 mg | ORAL_TABLET | Freq: Every day | ORAL | 1 refills | Status: DC

## 2023-03-14 MED ORDER — ALBUTEROL SULFATE HFA 108 (90 BASE) MCG/ACT IN AERS: 2.0000 | INHALATION_SPRAY | Freq: Four times a day (QID) | RESPIRATORY_TRACT | 1 refills | Status: AC | PRN

## 2023-03-14 MED ORDER — FAMOTIDINE 40 MG PO TABS: 40.0000 mg | ORAL_TABLET | Freq: Every evening | ORAL | 1 refills | Status: DC

## 2023-03-14 MED ORDER — OMEPRAZOLE 40 MG PO CPDR: 40.0000 mg | DELAYED_RELEASE_CAPSULE | Freq: Two times a day (BID) | ORAL | 5 refills | Status: DC

## 2023-03-14 MED ORDER — BUDESONIDE-FORMOTEROL FUMARATE 160-4.5 MCG/ACT IN AERO: 2.0000 | INHALATION_SPRAY | Freq: Two times a day (BID) | RESPIRATORY_TRACT | 1 refills | Status: DC

## 2023-03-14 MED ORDER — LEVOCETIRIZINE DIHYDROCHLORIDE 5 MG PO TABS: 5.0000 mg | ORAL_TABLET | Freq: Every day | ORAL | 1 refills | Status: AC | PRN

## 2023-03-14 NOTE — Patient Instructions (Addendum)
  1. Allergen avoidance measures - dust mite, grass, weed (mask when mowing)  2. Continue to Treat and prevent inflammation:   A. Symbicort 160 - 2 inhalations 1-2 times per day w/spacer.    B. Ryaltris - 2 sprays each nostril 1-2 times per day  C. Montelukast 10 - 1 tablet 1 time per day  D. Immunotherapy. (Epipen)   3. Continue to Treat and prevent reflux:   A. Omeprazole 40 mg - 2 times per day  B. Famotidine 40 mg in PM   C. Replace throat clearing with swallowing / drinking maneuver  D. Evaluation of throat with ENT  4.  Continue CPAP for treatment of sleep apnea  5. If needed:   A. Albuterol HFA 2 puffs every 4-6 hours   B. nasal saline spray  C. OTC antihistamine  6. Return to clinic in 12 weeks or earlier if problem  7. Obtain flu vaccine

## 2023-03-14 NOTE — Progress Notes (Unsigned)
[place] - [place] - [place] - [place] - [place]   Follow-up Note  Referring Provider: Gareth Morgan, MD Primary Provider: Gareth Morgan, MD Date of Office Visit: 03/14/2023  Subjective:   Kevin [name] (DOB: [birth year]) is a 67 y.o. male who [facility] in re-evaluation of the following:  HPI: Kevin [name] returns to this clinic in evaluation of asthma, allergic rhinitis, LPR, sleep apnea.  I last saw him in this clinic 10 January 2023.  He still continues to have an issue tied up with mucus in his throat and throat clearing and cough and he is actually developed a little bit of dull pain in the front of his throat.  He does not have any swallowing problems or speech problems.  He is treating his reflux with omeprazole twice a day and famotidine in the evening.  He has had very little problems with chest tightness or shortness of breath and he does not use any short acting bronchodilator and he has no issues with his upper airway while he continues on a collection of anti-inflammatory agents for his airway including Symbicort and Ryaltris and montelukast and immunotherapy.  His sleep apnea is going quite well while using CPAP.  He did contract some form of respiratory tract illness that was relatively short-lived affecting mostly involving his chest soon after visiting with me in the clinic but fortunately that issue was completely resolved at this point in time.  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  Allergies as of 03/14/2023   No Known Allergies      Medication List    albuterol 108 (90 Base) MCG/ACT inhaler Commonly known as: VENTOLIN HFA Inhale 2 puffs into the lungs every 6 (six) hours as needed.   budesonide-formoterol 160-4.5 MCG/ACT inhaler Commonly known as: SYMBICORT Inhale 2 puffs into the lungs in the morning and at bedtime.   EPINEPHrine 0.3  mg/0.3 mL Soaj injection Commonly known as: EPI-PEN Inject 0.3 mg into the muscle as needed for anaphylaxis.   famotidine 40 MG tablet Commonly known as: PEPCID Take 1 tablet (40 mg total) by mouth at bedtime.   levocetirizine 5 MG tablet Commonly known as: XYZAL Take 1 tablet (5 mg total) by mouth daily as needed for allergies (Can take an extar dose during flare ups.).   lisinopril 20 MG tablet Commonly known as: ZESTRIL Take 20 mg by mouth daily.   Methylcellulose (Laxative) 500 MG Tabs Take 2 tablets by mouth daily.   montelukast 10 MG tablet Commonly known as: SINGULAIR Take 1 tablet (10 mg total) by mouth at bedtime. TAKE 1 TABLET(10 MG) BY MOUTH AT BEDTIME   omeprazole 40 MG capsule Commonly known as: PRILOSEC Take 1 capsule (40 mg total) by mouth in the morning and at bedtime.   OSTEO BI-FLEX ADV JOINT SHIELD PO Take 1 tablet by mouth daily.   PRESCRIPTION MEDICATION ALLERGY VACCINE -- Use as directed weekly.   Ryaltris 409-81 MCG/ACT Susp Generic drug: Olopatadine-Mometasone Place 1 to 2 sprays in each nostril twice a day for runny nose/drainage down throat/stuffy nose   simvastatin 20 MG tablet Commonly known as: ZOCOR Take 1 tablet (20 mg total) by mouth at bedtime.    Past Medical History:  Diagnosis Date   ALLERGIC RHINITIS    Asthma    Dyslipidemia    Family history of heart disease    Hepatitis  3rd Grade   History of insomnia    History of nuclear stress test 08/2007    negative bruce myoview   History of palpitations     Past Surgical History:  Procedure Laterality Date   COLONOSCOPY N/A 07/12/2017   Procedure: COLONOSCOPY;  Surgeon: Corbin Ade, MD;  [facility];  Service: Endoscopy;  Laterality: N/A;   NASAL SEPTOPLASTY W/ TURBINOPLASTY  2000   deviated septum   POLYPECTOMY  07/12/2017   Procedure: POLYPECTOMY;  Surgeon: Corbin Ade, MD;  [facility]: [facility];  Service: Endoscopy;;  recto-sigmoid colon   Sleep  Study  07/11/2005   AHI during total sleep 3.68/hr, during REM sleep 5.52/hr   TONSILLECTOMY     TRANSTHORACIC ECHOCARDIOGRAM  09/13/2007   EF=>55%, borderline asymmetric LVH; mild MR/TR, normal RVSP; AV mildly sclerotic, trace AV regurg & trave pulm vavle regurg   varicocoel      Review of systems negative except as noted in HPI / PMHx or noted below:  Review of Systems  Constitutional: Negative.   HENT: Negative.    Eyes: Negative.   Respiratory: Negative.    Cardiovascular: Negative.   Gastrointestinal: Negative.   Genitourinary: Negative.   Musculoskeletal: Negative.   Skin: Negative.   Neurological: Negative.   Endo/Heme/Allergies: Negative.   Psychiatric/Behavioral: Negative.       Objective:   Vitals:   03/14/23 0920  BP: 120/78  Pulse: 85  Resp: 16  Temp: 98.3 F (36.8 C)  SpO2: 98%   Height: 5\' 9"  (175.3 cm)  Weight: 186 lb 11.2 oz (84.7 kg) (with shoes)   Physical Exam Constitutional:      Appearance: He is not diaphoretic.     Comments: Incessant throat clearing  HENT:     Head: Normocephalic.     Right Ear: Tympanic membrane, ear canal and external ear normal.     Left Ear: Tympanic membrane, ear canal and external ear normal.     Nose: Nose normal. No mucosal edema or rhinorrhea.     Mouth/Throat:     Pharynx: Uvula midline. No oropharyngeal exudate.  Eyes:     Conjunctiva/sclera: Conjunctivae normal.  Neck:     Thyroid: No thyromegaly.     Trachea: Trachea normal. No tracheal tenderness or tracheal deviation.  Cardiovascular:     Rate and Rhythm: Normal rate and regular rhythm.     Heart sounds: Normal heart sounds, S1 normal and S2 normal. No murmur heard. Pulmonary:     Effort: No respiratory distress.     Breath sounds: Normal breath sounds. No stridor. No wheezing or rales.  Lymphadenopathy:     Head:     Right side of head: No tonsillar adenopathy.     Left side of head: No tonsillar adenopathy.     Cervical: No cervical adenopathy.   Skin:    Findings: No erythema or rash.     Nails: There is no clubbing.  Neurological:     Mental Status: He is alert.     Diagnostics: Spirometry was performed and demonstrated an FEV1 of 3.74 at 115 % of predicted.  Assessment and Plan:   1. Asthma, moderate persistent, well-controlled   2. Perennial allergic rhinitis   3. Seasonal allergic rhinitis due to pollen   4. LPRD (laryngopharyngeal reflux disease)   5. Obstructive sleep apnea syndrome    1. Allergen avoidance measures - dust mite, grass, weed (mask when mowing)  2. Continue to Treat and prevent inflammation:  A. Symbicort 160 - 2 inhalations 1-2 times per day w/spacer.    B. Ryaltris - 2 sprays each nostril 1-2 times per day  C. Montelukast 10 - 1 tablet 1 time per day  D. Immunotherapy. (Epipen)   3. Continue to Treat and prevent reflux:   A. Omeprazole 40 mg - 2 times per day  B. Famotidine 40 mg in PM   C. Replace throat clearing with swallowing / drinking maneuver  D. Evaluation of throat with ENT  4.  Continue CPAP for treatment of sleep apnea  5. If needed:   A. Albuterol HFA 2 puffs every 4-6 hours   B. nasal saline spray  C. OTC antihistamine  6. Return to clinic in 12 weeks or earlier if problem  7. Obtain flu vaccine  It does appear as though Shonte's asthma is under very good control at this point in time and it does appear as though his upper airway disease is under very good control at this point in time on his current therapy of anti-inflammatory medications for both his upper and lower airway and the use of immunotherapy.  He has a history very consistent with LPR but it is not responding to omeprazole and famotidine and we will now have his throat evaluated with ENT.  I will see him back in this clinic in 12 weeks or earlier if there is a problem.  Laurette Schimke, MD Allergy / Immunology [facility]

## 2023-03-15 ENCOUNTER — Encounter: Payer: Self-pay | Admitting: Allergy and Immunology

## 2023-03-29 ENCOUNTER — Ambulatory Visit (INDEPENDENT_AMBULATORY_CARE_PROVIDER_SITE_OTHER): Payer: Federal, State, Local not specified - PPO

## 2023-03-29 DIAGNOSIS — J309 Allergic rhinitis, unspecified: Secondary | ICD-10-CM

## 2023-04-05 ENCOUNTER — Ambulatory Visit (INDEPENDENT_AMBULATORY_CARE_PROVIDER_SITE_OTHER): Payer: Self-pay

## 2023-04-05 DIAGNOSIS — J309 Allergic rhinitis, unspecified: Secondary | ICD-10-CM

## 2023-04-12 ENCOUNTER — Ambulatory Visit (INDEPENDENT_AMBULATORY_CARE_PROVIDER_SITE_OTHER): Payer: Self-pay

## 2023-04-12 DIAGNOSIS — J309 Allergic rhinitis, unspecified: Secondary | ICD-10-CM

## 2023-04-28 ENCOUNTER — Ambulatory Visit (INDEPENDENT_AMBULATORY_CARE_PROVIDER_SITE_OTHER): Payer: Self-pay

## 2023-04-28 DIAGNOSIS — J309 Allergic rhinitis, unspecified: Secondary | ICD-10-CM

## 2023-05-24 ENCOUNTER — Ambulatory Visit (INDEPENDENT_AMBULATORY_CARE_PROVIDER_SITE_OTHER): Payer: Self-pay | Admitting: *Deleted

## 2023-05-24 DIAGNOSIS — J309 Allergic rhinitis, unspecified: Secondary | ICD-10-CM

## 2023-05-26 ENCOUNTER — Ambulatory Visit (HOSPITAL_COMMUNITY)
Admission: RE | Admit: 2023-05-26 | Discharge: 2023-05-26 | Disposition: A | Discharge status: Home / Self Care | Payer: Federal, State, Local not specified - PPO | Source: Ambulatory Visit | Attending: Nurse Practitioner | Admitting: Nurse Practitioner

## 2023-05-26 ENCOUNTER — Other Ambulatory Visit (HOSPITAL_COMMUNITY): Payer: Self-pay | Admitting: Nurse Practitioner

## 2023-05-26 DIAGNOSIS — R059 Cough, unspecified: Secondary | ICD-10-CM | POA: Insufficient documentation

## 2023-06-12 ENCOUNTER — Other Ambulatory Visit: Payer: Self-pay | Admitting: Allergy and Immunology

## 2023-06-20 ENCOUNTER — Encounter: Payer: Self-pay | Admitting: Allergy and Immunology

## 2023-06-20 ENCOUNTER — Other Ambulatory Visit: Payer: Self-pay

## 2023-06-20 ENCOUNTER — Ambulatory Visit (INDEPENDENT_AMBULATORY_CARE_PROVIDER_SITE_OTHER): Payer: Medicare Other | Admitting: Allergy and Immunology

## 2023-06-20 VITALS — BP 116/74 | HR 92 | Temp 98.6°F | Resp 18 | Ht 69.0 in | Wt 182.2 lb

## 2023-06-20 DIAGNOSIS — B999 Unspecified infectious disease: Secondary | ICD-10-CM

## 2023-06-20 DIAGNOSIS — J4540 Moderate persistent asthma, uncomplicated: Secondary | ICD-10-CM

## 2023-06-20 DIAGNOSIS — J301 Allergic rhinitis due to pollen: Secondary | ICD-10-CM

## 2023-06-20 DIAGNOSIS — G4733 Obstructive sleep apnea (adult) (pediatric): Secondary | ICD-10-CM

## 2023-06-20 DIAGNOSIS — J3089 Other allergic rhinitis: Secondary | ICD-10-CM | POA: Diagnosis not present

## 2023-06-20 DIAGNOSIS — K219 Gastro-esophageal reflux disease without esophagitis: Secondary | ICD-10-CM

## 2023-06-20 MED ORDER — OMEPRAZOLE 40 MG PO CPDR: 40.0000 mg | DELAYED_RELEASE_CAPSULE | Freq: Two times a day (BID) | ORAL | 1 refills | Status: DC

## 2023-06-20 MED ORDER — RYALTRIS 665-25 MCG/ACT NA SUSP: NASAL | 1 refills | Status: DC

## 2023-06-20 MED ORDER — BUDESONIDE-FORMOTEROL FUMARATE 160-4.5 MCG/ACT IN AERO: 2.0000 | INHALATION_SPRAY | Freq: Two times a day (BID) | RESPIRATORY_TRACT | 1 refills | Status: DC

## 2023-06-20 MED ORDER — AIRSUPRA 90-80 MCG/ACT IN AERO: 2.0000 | INHALATION_SPRAY | RESPIRATORY_TRACT | 1 refills | Status: DC | PRN

## 2023-06-20 MED ORDER — MONTELUKAST SODIUM 10 MG PO TABS: 10.0000 mg | ORAL_TABLET | Freq: Every day | ORAL | 1 refills | Status: DC

## 2023-06-20 NOTE — Progress Notes (Unsigned)
 [place] - [place] - [place] - [place] - [place]   Follow-up Note  Referring Provider: Gareth Morgan, MD Primary Provider: Gareth Morgan, MD Date of Office Visit: 06/20/2023  Subjective:   Kevin [name] (DOB: Apr 14, 1956) is a 68 y.o. male who [facility] in re-evaluation of the following:  HPI: Kevin [name] returns to this clinic in evaluation of asthma, allergic rhinitis, LPR, sleep apnea.  I last saw him in this clinic 14 March 2023.  He is apparently contracted several viral respiratory tract infections from his grandchildren who he babysits during the week including what sounds like RSV.  He has required 2 courses of systemic steroids for continued issues with head congestion and nasal congestion and coughing.  Fortunately, at this point in time he is doing much better and he does not have any anosmia or ugly nasal discharge or headaches or significant issues with his ears or any problem with shortness of breath or chest tightness or chest pain.  When we last saw him in this clinic he was having significant problems with his LPR and we increased his omeprazole and had him continue on famotidine and he is much better regarding that issue.  We did recommend that he visit with ENT to look at his throat but because he was so much better he canceled that appointment.  He continues on CPAP for sleep apnea.  He continues on immunotherapy every 4 weeks without adverse effect.  He continues to use Symbicort and Ryaltris no spray and montelukast on a consistent basis and currently is using omeprazole twice a day and famotidine in the evening.  Allergies as of 06/20/2023   No Known Allergies      Medication List    albuterol 108 (90 Base) MCG/ACT inhaler Commonly known as: VENTOLIN HFA Inhale 2 puffs into the lungs every 6 (six) hours as needed. Patient will call when ready to refill   budesonide-formoterol 160-4.5 MCG/ACT  inhaler Commonly known as: SYMBICORT Inhale 2 puffs into the lungs in the morning and at bedtime.   EPINEPHrine 0.3 mg/0.3 mL Soaj injection Commonly known as: EPI-PEN Inject 0.3 mg into the muscle as needed for anaphylaxis.   famotidine 40 MG tablet Commonly known as: PEPCID TAKE 1 TABLET AT BEDTIME   levocetirizine 5 MG tablet Commonly known as: XYZAL Take 1 tablet (5 mg total) by mouth daily as needed for allergies (Can take an extar dose during flare ups.).   lisinopril 20 MG tablet Commonly known as: ZESTRIL Take 20 mg by mouth daily.   Methylcellulose (Laxative) 500 MG Tabs Take 2 tablets by mouth daily.   montelukast 10 MG tablet Commonly known as: SINGULAIR Take 1 tablet (10 mg total) by mouth at bedtime. TAKE 1 TABLET(10 MG) BY MOUTH AT BEDTIME   omeprazole 40 MG capsule Commonly known as: PRILOSEC Take 1 capsule (40 mg total) by mouth in the morning and at bedtime.   OSTEO BI-FLEX ADV JOINT SHIELD PO Take 1 tablet by mouth daily.   PRESCRIPTION MEDICATION ALLERGY VACCINE -- Use as directed weekly.   Ryaltris 132-44 MCG/ACT Susp Generic drug: Olopatadine-Mometasone Place 1 to 2 sprays in each nostril twice a day for runny nose/drainage down throat/stuffy nose   simvastatin 20 MG tablet Commonly known as: ZOCOR Take 1 tablet (20 mg total) by mouth at bedtime.      Past Medical History:  Diagnosis Date  . ALLERGIC RHINITIS   . Asthma   . Dyslipidemia   .  Family history of heart disease   . Hepatitis    3rd Grade  . History of insomnia   . History of nuclear stress test 08/2007    negative bruce myoview  . History of palpitations     Past Surgical History:  Procedure Laterality Date  . COLONOSCOPY N/A 07/12/2017   Procedure: COLONOSCOPY;  Surgeon: Corbin Ade, MD;  [facility];  Service: Endoscopy;  Laterality: N/A;  . NASAL SEPTOPLASTY W/ TURBINOPLASTY  2000   deviated septum  . POLYPECTOMY  07/12/2017   Procedure: POLYPECTOMY;   Surgeon: Corbin Ade, MD;  [facility];  Service: Endoscopy;;  recto-sigmoid colon  . Sleep Study  07/11/2005   AHI during total sleep 3.68/hr, during REM sleep 5.52/hr  . TONSILLECTOMY    . TRANSTHORACIC ECHOCARDIOGRAM  09/13/2007   EF=>55%, borderline asymmetric LVH; mild MR/TR, normal RVSP; AV mildly sclerotic, trace AV regurg & trave pulm vavle regurg  . varicocoel      Review of systems negative except as noted in HPI / PMHx or noted below:  Review of Systems  Constitutional: Negative.   HENT: Negative.    Eyes: Negative.   Respiratory: Negative.    Cardiovascular: Negative.   Gastrointestinal: Negative.   Genitourinary: Negative.   Musculoskeletal: Negative.   Skin: Negative.   Neurological: Negative.   Endo/Heme/Allergies: Negative.   Psychiatric/Behavioral: Negative.       Objective:   Vitals:   06/20/23 0918  BP: 116/74  Pulse: 92  Resp: 18  Temp: 98.6 F (37 C)  SpO2: 99%   Height: 5\' 9"  (175.3 cm)  Weight: 182 lb 3.2 oz (82.6 kg)   Physical Exam Constitutional:      Appearance: He is not diaphoretic.  HENT:     Head: Normocephalic.     Right Ear: Tympanic membrane, ear canal and external ear normal.     Left Ear: Tympanic membrane, ear canal and external ear normal.     Nose: Septal deviation present. No mucosal edema or rhinorrhea.     Mouth/Throat:     Pharynx: Uvula midline. No oropharyngeal exudate.  Eyes:     Conjunctiva/sclera: Conjunctivae normal.  Neck:     Thyroid: No thyromegaly.     Trachea: Trachea normal. No tracheal tenderness or tracheal deviation.  Cardiovascular:     Rate and Rhythm: Normal rate and regular rhythm.     Heart sounds: Normal heart sounds, S1 normal and S2 normal. No murmur heard. Pulmonary:     Effort: No respiratory distress.     Breath sounds: Normal breath sounds. No stridor. No wheezing or rales.  Lymphadenopathy:     Head:     Right side of head: No tonsillar adenopathy.     Left side of  head: No tonsillar adenopathy.     Cervical: No cervical adenopathy.  Skin:    Findings: No erythema or rash.     Nails: There is no clubbing.  Neurological:     Mental Status: He is alert.    Diagnostics:    Spirometry was performed and demonstrated an FEV1 of 3.91 at 121 % of predicted.  The patient had an Asthma Control Test with the following results:  .    Assessment and Plan:   1. Not well controlled moderate persistent asthma   2. Perennial allergic rhinitis   3. Seasonal allergic rhinitis due to pollen   4. LPRD (laryngopharyngeal reflux disease)   5. Obstructive sleep apnea syndrome  6. Recurrent infections     Patient Instructions   1. Allergen avoidance measures - dust mite, grass, weed (mask when mowing)  2. Continue to Treat and prevent inflammation:   A. Symbicort 160 - 2 inhalations 1-2 times per day w/spacer.    B. Ryaltris - 2 sprays each nostril 1-2 times per day  C. Montelukast 10 - 1 tablet 1 time per day  D. Immunotherapy. (Epipen)   3. Continue to Treat and prevent reflux:   A. Omeprazole 40 mg - 2 times per day  B. Famotidine 40 mg in PM   C. Replace throat clearing with swallowing / drinking maneuver  4.  Continue CPAP for treatment of sleep apnea  5. If needed:   A. AIRSUPRA - 2 puffs every 4-6 hours (replaces albuterol)  B. nasal saline spray  C. OTC antihistamine  6. Blood - CBC w/d, IgA/G/M, anti-pneumo 23 serotypes  6. Return to clinic in 12 weeks or earlier if problem  7. Influenza = Tamiflu. Covid = Paxlovid     Laurette Schimke, MD Allergy / Immunology [facility]

## 2023-06-20 NOTE — Patient Instructions (Addendum)
  1. Allergen avoidance measures - dust mite, grass, weed (mask when mowing)  2. Continue to Treat and prevent inflammation:   A. Symbicort 160 - 2 inhalations 1-2 times per day w/spacer.    B. Ryaltris - 2 sprays each nostril 1-2 times per day  C. Montelukast 10 - 1 tablet 1 time per day  D. Immunotherapy. (Epipen)   3. Continue to Treat and prevent reflux:   A. Omeprazole 40 mg - 2 times per day  B. Famotidine 40 mg in PM   C. Replace throat clearing with swallowing / drinking maneuver  4.  Continue CPAP for treatment of sleep apnea  5. If needed:   A. AIRSUPRA - 2 puffs every 4-6 hours (replaces albuterol)  B. nasal saline spray  C. OTC antihistamine  6. Blood - CBC w/d, IgA/G/M, anti-pneumo 23 serotypes  6. Return to clinic in 12 weeks or earlier if problem  7. Influenza = Tamiflu. Covid = Paxlovid

## 2023-06-21 ENCOUNTER — Ambulatory Visit (INDEPENDENT_AMBULATORY_CARE_PROVIDER_SITE_OTHER): Payer: Self-pay

## 2023-06-21 ENCOUNTER — Encounter: Payer: Self-pay | Admitting: Allergy and Immunology

## 2023-06-21 DIAGNOSIS — J309 Allergic rhinitis, unspecified: Secondary | ICD-10-CM

## 2023-06-28 LAB — STREP PNEUMONIAE 23 SEROTYPES IGG
Pneumo Ab Type 1*: 0.4 ug/mL — ABNORMAL LOW (ref >1.3)
Pneumo Ab Type 12 (12F)*: <0.1 ug/mL — ABNORMAL LOW (ref >1.3)
Pneumo Ab Type 14*: 3.8 ug/mL (ref >1.3)
Pneumo Ab Type 17 (17F)*: 0.3 ug/mL — ABNORMAL LOW (ref >1.3)
Pneumo Ab Type 19 (19F)*: 0.3 ug/mL — ABNORMAL LOW (ref >1.3)
Pneumo Ab Type 2*: 1.9 ug/mL (ref >1.3)
Pneumo Ab Type 20*: 2.9 ug/mL (ref >1.3)
Pneumo Ab Type 22 (22F)*: <0.1 ug/mL — ABNORMAL LOW (ref >1.3)
Pneumo Ab Type 23 (23F)*: 0.3 ug/mL — ABNORMAL LOW (ref >1.3)
Pneumo Ab Type 26 (6B)*: 2.2 ug/mL (ref >1.3)
Pneumo Ab Type 3*: <0.1 ug/mL — ABNORMAL LOW (ref >1.3)
Pneumo Ab Type 34 (10A)*: 3.6 ug/mL (ref >1.3)
Pneumo Ab Type 4*: 0.2 ug/mL — ABNORMAL LOW (ref >1.3)
Pneumo Ab Type 43 (11A)*: 0.2 ug/mL — ABNORMAL LOW (ref >1.3)
Pneumo Ab Type 5*: 0.3 ug/mL — ABNORMAL LOW (ref >1.3)
Pneumo Ab Type 51 (7F)*: 0.2 ug/mL — ABNORMAL LOW (ref >1.3)
Pneumo Ab Type 54 (15B)*: 4.9 ug/mL (ref >1.3)
Pneumo Ab Type 56 (18C)*: 1.1 ug/mL — ABNORMAL LOW (ref >1.3)
Pneumo Ab Type 57 (19A)*: 2 ug/mL (ref >1.3)
Pneumo Ab Type 68 (9V)*: 1.3 ug/mL — ABNORMAL LOW (ref >1.3)
Pneumo Ab Type 70 (33F)*: 1.1 ug/mL — ABNORMAL LOW (ref >1.3)
Pneumo Ab Type 8*: 4 ug/mL (ref >1.3)
Pneumo Ab Type 9 (9N)*: 1 ug/mL — ABNORMAL LOW (ref >1.3)

## 2023-06-28 LAB — CBC WITH DIFFERENTIAL/PLATELET
Basophils Absolute: 0 10*3/uL (ref 0.0–0.2)
Basos: 1 %
EOS (ABSOLUTE): 0.2 10*3/uL (ref 0.0–0.4)
Eos: 4 %
Hematocrit: 39.3 % (ref 37.5–51.0)
Hemoglobin: 12.8 g/dL — ABNORMAL LOW (ref 13.0–17.7)
Immature Grans (Abs): 0 10*3/uL (ref 0.0–0.1)
Immature Granulocytes: 0 %
Lymphocytes Absolute: 0.9 10*3/uL (ref 0.7–3.1)
Lymphs: 17 %
MCH: 30.6 pg (ref 26.6–33.0)
MCHC: 32.6 g/dL (ref 31.5–35.7)
MCV: 94 fL (ref 79–97)
Monocytes Absolute: 0.5 10*3/uL (ref 0.1–0.9)
Monocytes: 9 %
Neutrophils Absolute: 3.9 10*3/uL (ref 1.4–7.0)
Neutrophils: 69 %
Platelets: 259 10*3/uL (ref 150–450)
RBC: 4.18 x10E6/uL (ref 4.14–5.80)
RDW: 13.2 % (ref 11.6–15.4)
WBC: 5.6 10*3/uL (ref 3.4–10.8)

## 2023-06-28 LAB — IGG, IGA, IGM
IgA/Immunoglobulin A, Serum: 269 mg/dL (ref 61–437)
IgG (Immunoglobin G), Serum: 738 mg/dL (ref 603–1613)
IgM (Immunoglobulin M), Srm: 29 mg/dL (ref 20–172)

## 2023-07-19 ENCOUNTER — Ambulatory Visit (INDEPENDENT_AMBULATORY_CARE_PROVIDER_SITE_OTHER): Payer: Self-pay

## 2023-07-19 DIAGNOSIS — J309 Allergic rhinitis, unspecified: Secondary | ICD-10-CM | POA: Diagnosis not present

## 2023-07-24 DIAGNOSIS — J3089 Other allergic rhinitis: Secondary | ICD-10-CM | POA: Diagnosis not present

## 2023-07-24 NOTE — Progress Notes (Signed)
 VIALS MADE 07-24-23

## 2023-08-16 ENCOUNTER — Ambulatory Visit (INDEPENDENT_AMBULATORY_CARE_PROVIDER_SITE_OTHER): Payer: Self-pay

## 2023-08-16 DIAGNOSIS — J309 Allergic rhinitis, unspecified: Secondary | ICD-10-CM

## 2023-08-23 ENCOUNTER — Other Ambulatory Visit: Payer: Self-pay | Admitting: Allergy and Immunology

## 2023-09-13 ENCOUNTER — Ambulatory Visit (INDEPENDENT_AMBULATORY_CARE_PROVIDER_SITE_OTHER)

## 2023-09-13 DIAGNOSIS — J309 Allergic rhinitis, unspecified: Secondary | ICD-10-CM | POA: Diagnosis not present

## 2023-10-11 ENCOUNTER — Ambulatory Visit (INDEPENDENT_AMBULATORY_CARE_PROVIDER_SITE_OTHER): Payer: Self-pay

## 2023-10-11 DIAGNOSIS — J309 Allergic rhinitis, unspecified: Secondary | ICD-10-CM

## 2023-10-24 ENCOUNTER — Encounter: Payer: Self-pay | Admitting: Allergy and Immunology

## 2023-10-24 ENCOUNTER — Ambulatory Visit (INDEPENDENT_AMBULATORY_CARE_PROVIDER_SITE_OTHER): Payer: Medicare Other | Admitting: Allergy and Immunology

## 2023-10-24 VITALS — BP 116/72 | HR 88 | Temp 98.7°F | Ht 69.0 in | Wt 180.8 lb

## 2023-10-24 DIAGNOSIS — K219 Gastro-esophageal reflux disease without esophagitis: Secondary | ICD-10-CM | POA: Diagnosis not present

## 2023-10-24 DIAGNOSIS — J3089 Other allergic rhinitis: Secondary | ICD-10-CM

## 2023-10-24 DIAGNOSIS — J301 Allergic rhinitis due to pollen: Secondary | ICD-10-CM | POA: Diagnosis not present

## 2023-10-24 DIAGNOSIS — J4540 Moderate persistent asthma, uncomplicated: Secondary | ICD-10-CM

## 2023-10-24 DIAGNOSIS — G4733 Obstructive sleep apnea (adult) (pediatric): Secondary | ICD-10-CM

## 2023-10-24 MED ORDER — RYALTRIS 665-25 MCG/ACT NA SUSP: NASAL | 1 refills | Status: DC

## 2023-10-24 MED ORDER — MONTELUKAST SODIUM 10 MG PO TABS: 10.0000 mg | ORAL_TABLET | Freq: Every day | ORAL | 1 refills | Status: DC

## 2023-10-24 MED ORDER — SPACER/AERO-HOLDING CHAMBERS DEVI: 1.0000 | 1 refills | Status: AC

## 2023-10-24 MED ORDER — AIRSUPRA 90-80 MCG/ACT IN AERO: 2.0000 | INHALATION_SPRAY | RESPIRATORY_TRACT | 1 refills | Status: DC | PRN

## 2023-10-24 MED ORDER — OMEPRAZOLE 40 MG PO CPDR
40.0000 mg | DELAYED_RELEASE_CAPSULE | Freq: Two times a day (BID) | ORAL | 1 refills | Status: DC
Start: 2023-10-24 — End: 2024-02-29

## 2023-10-24 MED ORDER — FAMOTIDINE 40 MG PO TABS: 40.0000 mg | ORAL_TABLET | Freq: Every evening | ORAL | 1 refills | Status: DC

## 2023-10-24 MED ORDER — BUDESONIDE-FORMOTEROL FUMARATE 160-4.5 MCG/ACT IN AERO: 2.0000 | INHALATION_SPRAY | Freq: Two times a day (BID) | RESPIRATORY_TRACT | 1 refills | Status: AC

## 2023-10-24 NOTE — Progress Notes (Unsigned)
 [place] - [place] - [place] - [place] - [place]   Follow-up Note  Referring Provider: Nemiah Kemps, MD Primary Provider: Nemiah Kemps, MD Date of Office Visit: 10/24/2023  Subjective:   Kevin [name] (DOB: Sep 27, 1955) is a 68 y.o. male who [facility] in re-evaluation of the following:  HPI: Isaid returns to this clinic in evaluation of asthma, allergic rhinitis, LPR, sleep apnea.  I last saw him in this clinic 20 June 2023.  He has done pretty well with allergen exposure and it has not created an exacerbation of either his upper or lower airway disease.  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  Once he gets an infection of his respiratory tract it takes months to completely resolve that issue.  He uses some guaifenesin and fexofenadine on a daily basis during these time frames and it sounds as though he may have required 1 course of prednisone  this spring to help clear up his mucus.  He does not really have shortness of breath or chest tightness he just has a sensation that there is mucus in his chest.  And he will have some head congestion as well.  But overall he is done pretty well with his asthma and rarely uses a short acting bronchodilator and can exert himself without any problem while he continues on Symbicort .  And for the most part his upper airway is working well and that he can smell and taste while using montelukast  and a nasal steroid/antihistamine combination.  His reflux is under very good control on his current plan of a proton pump inhibitor and H2 receptor blocker.  He continues on immunotherapy currently every 4 weeks without adverse effect.  He continues on CPAP for his sleep apnea.  When we last saw him in this clinic we checked him for a B-cell immunodeficiency and that did not appear to be a  condition that exists based upon the results of those blood test but he did have a hemoglobin that was relatively low.  He has had problems with iron deficiency in the past and he has had evaluation of his GI tract in the past for this condition.  He did inform me that he had donated blood in May 2025 and his hemoglobin was back up to 14.  Allergies as of 10/24/2023   No Known Allergies      Medication List    Airsupra  90-80 MCG/ACT Aero Generic drug: Albuterol -Budesonide  Inhale 2 puffs into the lungs every 4 (four) hours as needed.   albuterol  108 (90 Base) MCG/ACT inhaler Commonly known as: VENTOLIN  HFA Inhale 2 puffs into the lungs every 6 (six) hours as needed. Patient will call when ready to refill   budesonide -formoterol  160-4.5 MCG/ACT inhaler Commonly known as: SYMBICORT  Inhale 2 puffs into the lungs in the morning and at bedtime.   EPINEPHrine  0.3 mg/0.3 mL Soaj injection Commonly known as: EPI-PEN Inject 0.3 mg into the muscle as needed for anaphylaxis.   famotidine  40 MG tablet Commonly known as: PEPCID  TAKE 1 TABLET AT BEDTIME   levocetirizine 5 MG tablet Commonly known as: XYZAL  Take 1 tablet (5 mg total) by mouth daily as needed for allergies (Can take an extar dose during flare ups.).   lisinopril 20 MG tablet Commonly known as: ZESTRIL Take 20 mg by mouth daily.   Methylcellulose (Laxative) 500  MG Tabs Take 2 tablets by mouth daily.   montelukast  10 MG tablet Commonly known as: SINGULAIR  Take 1 tablet (10 mg total) by mouth at bedtime. TAKE 1 TABLET(10 MG) BY MOUTH AT BEDTIME   omeprazole  40 MG capsule Commonly known as: PRILOSEC Take 1 capsule (40 mg total) by mouth in the morning and at bedtime.   OSTEO BI-FLEX ADV JOINT SHIELD PO Take 1 tablet by mouth daily.   PRESCRIPTION MEDICATION ALLERGY  VACCINE -- Use as directed weekly.   Ryaltris  665-25 MCG/ACT Susp Generic drug: Olopatadine-Mometasone Place 1 to 2 sprays in each nostril twice a day  for runny nose/drainage down throat/stuffy nose   simvastatin 20 MG tablet Commonly known as: ZOCOR Take 1 tablet (20 mg total) by mouth at bedtime.    Past Medical History:  Diagnosis Date   ALLERGIC RHINITIS    Asthma    Dyslipidemia    Family history of heart disease    Hepatitis    3rd Grade   History of insomnia    History of nuclear stress test 08/2007    negative bruce myoview   History of palpitations     Past Surgical History:  Procedure Laterality Date   COLONOSCOPY N/A 07/12/2017   Procedure: COLONOSCOPY;  Surgeon: Shaaron Lamar HERO, MD;  [facility];  Service: Endoscopy;  Laterality: N/A;   NASAL SEPTOPLASTY W/ TURBINOPLASTY  2000   deviated septum   POLYPECTOMY  07/12/2017   Procedure: POLYPECTOMY;  Surgeon: Shaaron Lamar HERO, MD;  Location: [facility];  Service: Endoscopy;;  recto-sigmoid colon   Sleep Study  07/11/2005   AHI during total sleep 3.68/hr, during REM sleep 5.52/hr   TONSILLECTOMY     TRANSTHORACIC ECHOCARDIOGRAM  09/13/2007   EF=>55%, borderline asymmetric LVH; mild MR/TR, normal RVSP; AV mildly sclerotic, trace AV regurg & trave pulm vavle regurg   varicocoel      Review of systems negative except as noted in HPI / PMHx or noted below:  Review of Systems  Constitutional: Negative.   HENT: Negative.    Eyes: Negative.   Respiratory: Negative.    Cardiovascular: Negative.   Gastrointestinal: Negative.   Genitourinary: Negative.   Musculoskeletal: Negative.   Skin: Negative.   Neurological: Negative.   Endo/Heme/Allergies: Negative.   Psychiatric/Behavioral: Negative.       Objective:   Vitals:   10/24/23 0928  Pulse: 88  Temp: 98.7 F (37.1 C)  SpO2: 97%      Weight: 180 lb 12.8 oz (82 kg)   Physical Exam Constitutional:      Appearance: He is not diaphoretic.  HENT:     Head: Normocephalic.     Right Ear: Tympanic membrane, ear canal and external ear normal.     Left Ear: Tympanic membrane, ear canal and  external ear normal.     Nose: Nose normal. No mucosal edema or rhinorrhea.     Mouth/Throat:     Pharynx: Uvula midline. No oropharyngeal exudate.   Eyes:     Conjunctiva/sclera: Conjunctivae normal.   Neck:     Thyroid: No thyromegaly.     Trachea: Trachea normal. No tracheal tenderness or tracheal deviation.   Cardiovascular:     Rate and Rhythm: Normal rate and regular rhythm.     Heart sounds: Normal heart sounds, S1 normal and S2 normal. No murmur heard. Pulmonary:     Effort: No respiratory distress.     Breath sounds: Normal breath sounds. No stridor. No wheezing or rales.  Lymphadenopathy:     Head:     Right side of head: No tonsillar adenopathy.     Left side of head: No tonsillar adenopathy.     Cervical: No cervical adenopathy.   Skin:    Findings: No erythema or rash.     Nails: There is no clubbing.   Neurological:     Mental Status: He is alert.     Diagnostics:    Spirometry was performed and demonstrated an FEV1 of 3.95 at 123 % of predicted.  Results of blood tests obtained 20 June 2023 identified as IgG 738 Mg/DL, IgA 730 Mg/DL, IgM 29 Mg/DL, WBC 5.6, absolute eosinophil 200, absolute lymphocyte 900, hemoglobin 12.8, platelet 259, only 8 out of 23 serotypes of pneumococcus with adequate antibody coverage on a Pneumo 23 serotype assay  Assessment and Plan:   1. Asthma, moderate persistent, well-controlled   2. Perennial allergic rhinitis   3. Seasonal allergic rhinitis due to pollen   4. LPRD (laryngopharyngeal reflux disease)   5. Obstructive sleep apnea syndrome    1. Allergen avoidance measures - dust mite, grass, weed (mask when mowing)  2. Continue to Treat and prevent inflammation:   A. Symbicort  160 - 2 inhalations 1-2 times per day w/spacer.    B. Ryaltris  - 2 sprays each nostril 1-2 times per day  C. Montelukast  10 - 1 tablet 1 time per day  D. Immunotherapy. (Epipen )   3. Continue to Treat and prevent reflux:   A. Omeprazole   40 mg - 2 times per day  B. Famotidine  40 mg in PM   C. Replace throat clearing with swallowing / drinking maneuver  4.  Continue CPAP for treatment of sleep apnea  5. If needed:   A. AIRSUPRA  - 2 puffs every 4-6 hours (replaces albuterol )  B. nasal saline spray  C. OTC antihistamine  D. Guaifenesin  6. Return to clinic in 6 months or earlier if problem  7. Influenza = Tamiflu. Covid = Paxlovid  8.  Pneumovax / Prevnar 20  Overall Iden is doing pretty well with the insults to his respiratory tract which includes allergen exposure, reflux, and his grandchildren who are chronically infected with viruses.  He is going to remain on anti-inflammatory agents for his airway and continue on immunotherapy and also continue to aggressively treat his reflux as noted above.  I think it would be worthwhile for him to obtain the Pneumovax or Prevnar 20 as he is somewhat deficient in antibody protection directed against multiple serotypes of pneumococcus.  He can obtain that immunization with his primary care doctor.  We will see him back in this clinic in 6 months or earlier if there is a problem.  Camellia Denis, MD Allergy  / Immunology [facility]

## 2023-10-24 NOTE — Patient Instructions (Addendum)
  1. Allergen avoidance measures - dust mite, grass, weed (mask when mowing)  2. Continue to Treat and prevent inflammation:   A. Symbicort  160 - 2 inhalations 1-2 times per day w/spacer.    B. Ryaltris  - 2 sprays each nostril 1-2 times per day  C. Montelukast  10 - 1 tablet 1 time per day  D. Immunotherapy. (Epipen )   3. Continue to Treat and prevent reflux:   A. Omeprazole  40 mg - 2 times per day  B. Famotidine  40 mg in PM   C. Replace throat clearing with swallowing / drinking maneuver  4.  Continue CPAP for treatment of sleep apnea  5. If needed:   A. AIRSUPRA  - 2 puffs every 4-6 hours (replaces albuterol )  B. nasal saline spray  C. OTC antihistamine  D. Guaifenesin  6. Return to clinic in 6 months or earlier if problem  7. Influenza = Tamiflu. Covid = Paxlovid  8.  Pneumovax / Prevnar 20

## 2023-10-25 ENCOUNTER — Encounter: Payer: Self-pay | Admitting: Allergy and Immunology

## 2023-11-01 ENCOUNTER — Ambulatory Visit (INDEPENDENT_AMBULATORY_CARE_PROVIDER_SITE_OTHER): Payer: Self-pay

## 2023-11-01 DIAGNOSIS — J309 Allergic rhinitis, unspecified: Secondary | ICD-10-CM | POA: Diagnosis not present

## 2023-11-29 ENCOUNTER — Ambulatory Visit (INDEPENDENT_AMBULATORY_CARE_PROVIDER_SITE_OTHER): Payer: Self-pay

## 2023-11-29 DIAGNOSIS — J309 Allergic rhinitis, unspecified: Secondary | ICD-10-CM | POA: Diagnosis not present

## 2023-12-06 ENCOUNTER — Ambulatory Visit (INDEPENDENT_AMBULATORY_CARE_PROVIDER_SITE_OTHER): Payer: Self-pay

## 2023-12-06 DIAGNOSIS — J309 Allergic rhinitis, unspecified: Secondary | ICD-10-CM

## 2023-12-13 ENCOUNTER — Ambulatory Visit (INDEPENDENT_AMBULATORY_CARE_PROVIDER_SITE_OTHER): Payer: Self-pay

## 2023-12-13 DIAGNOSIS — J309 Allergic rhinitis, unspecified: Secondary | ICD-10-CM

## 2024-01-01 ENCOUNTER — Telehealth: Payer: Self-pay | Admitting: Allergy and Immunology

## 2024-01-01 MED ORDER — RYALTRIS 665-25 MCG/ACT NA SUSP: NASAL | 1 refills | Status: DC

## 2024-01-01 NOTE — Telephone Encounter (Signed)
 Ryaltris  sent into [facility] as that is the pharmacy we use now. I called the patient to inform. I left a message to call the office back.

## 2024-01-01 NOTE — Telephone Encounter (Signed)
 Pt requesting a refill for ryaltris  to [facility].

## 2024-01-02 NOTE — Telephone Encounter (Signed)
 PT called back to advise [facility] had told him that Dr Maurilio needed to send some type of new document to them to fill Rx - I reviewed with Rosaline and advised PT that they could be calling in error since Rx has been sent/filled by [facility].  He stated that they are requiring him to use an app/text system and he uses a flip-phone so not comfortable with that method. Will send back to clinical for further review, PT stated he thinks that [facility] will be calling him back tomorrow

## 2024-01-02 NOTE — Telephone Encounter (Signed)
 Tried calling no answer- left a detailed message providing him [facility] phone number [phone]. He can reach out to them to verify shipment and payment. I told him to disregard [facility].

## 2024-01-02 NOTE — Telephone Encounter (Signed)
 PT returned phone call - I advised Ryaltris  now sent to [facility] - he thanked

## 2024-01-10 ENCOUNTER — Ambulatory Visit (INDEPENDENT_AMBULATORY_CARE_PROVIDER_SITE_OTHER): Payer: Self-pay

## 2024-01-10 DIAGNOSIS — J309 Allergic rhinitis, unspecified: Secondary | ICD-10-CM | POA: Diagnosis not present

## 2024-02-07 ENCOUNTER — Ambulatory Visit (INDEPENDENT_AMBULATORY_CARE_PROVIDER_SITE_OTHER)

## 2024-02-07 DIAGNOSIS — J309 Allergic rhinitis, unspecified: Secondary | ICD-10-CM | POA: Diagnosis not present

## 2024-02-16 ENCOUNTER — Other Ambulatory Visit: Payer: Self-pay | Admitting: Allergy and Immunology

## 2024-02-27 ENCOUNTER — Telehealth: Payer: Self-pay | Admitting: Allergy and Immunology

## 2024-02-27 NOTE — Telephone Encounter (Signed)
 Kevin [name] called and stated that in July he was taking omeprazole  (PRILOSEC) 40 MG capsule [[id number]] , twice a day and that it was working well for him as he discussed with Kozlow. He states that now it has been sent into the pharmacy as omeprazole  (PRILOSEC) 20 MG capsule [[id number]] and directions to take once per day. He states this is not working, and he wants to know if this was a mistake or if Kozlow really wants him to take it this way.

## 2024-02-29 MED ORDER — OMEPRAZOLE 40 MG PO CPDR: 40.0000 mg | DELAYED_RELEASE_CAPSULE | Freq: Two times a day (BID) | ORAL | 1 refills | Status: DC

## 2024-02-29 NOTE — Addendum Note (Signed)
 Addended by: Karmon Andis E on: 02/29/2024 11:35 AM   Modules accepted: Orders

## 2024-02-29 NOTE — Telephone Encounter (Signed)
 I called and spoke with patient to inform him that it was an error on our end and it has been resolved.

## 2024-03-06 ENCOUNTER — Ambulatory Visit (INDEPENDENT_AMBULATORY_CARE_PROVIDER_SITE_OTHER)

## 2024-03-06 DIAGNOSIS — J309 Allergic rhinitis, unspecified: Secondary | ICD-10-CM | POA: Diagnosis not present

## 2024-04-03 ENCOUNTER — Ambulatory Visit (INDEPENDENT_AMBULATORY_CARE_PROVIDER_SITE_OTHER)

## 2024-04-03 DIAGNOSIS — J309 Allergic rhinitis, unspecified: Secondary | ICD-10-CM

## 2024-04-08 DIAGNOSIS — J3089 Other allergic rhinitis: Secondary | ICD-10-CM | POA: Diagnosis not present

## 2024-04-08 DIAGNOSIS — J301 Allergic rhinitis due to pollen: Secondary | ICD-10-CM | POA: Diagnosis not present

## 2024-04-08 NOTE — Progress Notes (Signed)
 VIALS MADE ON 04/08/24

## 2024-04-09 LAB — LAB REPORT - SCANNED
A1c: 5.9
Albumin, Urine POC: 6.3
Creatinine, POC: 209.1 mg/dL
EGFR: 78
Microalb Creat Ratio: 3

## 2024-04-30 ENCOUNTER — Encounter: Payer: Self-pay | Admitting: Allergy and Immunology

## 2024-04-30 ENCOUNTER — Other Ambulatory Visit: Payer: Self-pay

## 2024-04-30 ENCOUNTER — Ambulatory Visit (INDEPENDENT_AMBULATORY_CARE_PROVIDER_SITE_OTHER): Payer: Self-pay | Admitting: Allergy and Immunology

## 2024-04-30 VITALS — BP 124/78 | HR 100 | Temp 100.0°F | Resp 16 | Ht 69.0 in | Wt 187.5 lb

## 2024-04-30 DIAGNOSIS — K219 Gastro-esophageal reflux disease without esophagitis: Secondary | ICD-10-CM

## 2024-04-30 DIAGNOSIS — J301 Allergic rhinitis due to pollen: Secondary | ICD-10-CM | POA: Diagnosis not present

## 2024-04-30 DIAGNOSIS — J4540 Moderate persistent asthma, uncomplicated: Secondary | ICD-10-CM

## 2024-04-30 MED ORDER — OMEPRAZOLE 40 MG PO CPDR: 40.0000 mg | DELAYED_RELEASE_CAPSULE | Freq: Two times a day (BID) | ORAL | 1 refills | Status: AC

## 2024-04-30 MED ORDER — EPINEPHRINE 0.3 MG/0.3ML IJ SOAJ: 0.3000 mg | INTRAMUSCULAR | 1 refills | Status: AC | PRN

## 2024-04-30 MED ORDER — FAMOTIDINE 40 MG PO TABS: 40.0000 mg | ORAL_TABLET | Freq: Every evening | ORAL | 1 refills | Status: AC

## 2024-04-30 MED ORDER — RYALTRIS 665-25 MCG/ACT NA SUSP: NASAL | 1 refills | Status: AC

## 2024-04-30 MED ORDER — AIRSUPRA 90-80 MCG/ACT IN AERO: 2.0000 | INHALATION_SPRAY | RESPIRATORY_TRACT | 1 refills | Status: AC | PRN

## 2024-04-30 MED ORDER — MONTELUKAST SODIUM 10 MG PO TABS: 10.0000 mg | ORAL_TABLET | Freq: Every day | ORAL | 1 refills | Status: AC

## 2024-04-30 NOTE — Patient Instructions (Signed)
" °  1. Allergen avoidance measures - dust mite, grass, weed (mask when mowing)  2. Continue to Treat and prevent inflammation:   A. Symbicort  160 - 2 inhalations 1-2 times per day w/spacer.    B. Ryaltris  - 2 sprays each nostril 1-2 times per day  C. Montelukast  10 - 1 tablet 1 time per day  D. Immunotherapy. (Epipen )   3. Continue to Treat and prevent reflux:   A. Omeprazole  40 mg - 2 times per day  B. Famotidine  40 mg in PM IF NEEDED  C. Replace throat clearing with swallowing / drinking maneuver  4.  Continue CPAP for treatment of sleep apnea  5. If needed:   A. AIRSUPRA  - 2 puffs every 4-6 hours    B. nasal saline spray  C. OTC antihistamine  D. Guaifenesin  6. Return to clinic in 6 months or earlier if problem  7. Influenza = Tamiflu. Covid = Paxlovid     "

## 2024-04-30 NOTE — Progress Notes (Unsigned)
 "  [place] - [place] - [place] - [place] - [place]   Follow-up Note  Referring Provider: Nemiah Kemps, MD Primary Provider: Nemiah Kemps, MD Date of Office Visit: 04/30/2024  Subjective:   Kevin [name] (DOB: [birth year]) is a 69 y.o. male who [facility]24 in re-evaluation of the following:  HPI: Lan returns to this clinic in evaluation of asthma, allergic rhinitis, LPR, sleep apnea.  I last saw him in this clinic 24 October 2023.  Overall he is done well with his asthma.  He is using his Airsupra  twice a day instead of his Symbicort  which appeared to be the result of a communication and issue.  He really has very little problems with his airway.  Occasionally has a little bit of cough.  He can exert himself to the extent he so desires without any problem.  He can have cold air exposure without any problem.  He has not required a systemic steroid to treat an exacerbation of asthma.  His upper airway is doing relatively well.  He has some intermittent nasal congestion but overall has done well.  He uses his combination nasal steroid/antihistamine on a consistent basis along with montelukast  and he continues on immunotherapy currently at every 4 weeks.  His reflux is under very good control with using omeprazole  and famotidine  in combination.  His sleep apnea is under good control with CPAP although occasionally he does lose a mask seal and then it blows up in his eye and wakes him up.  He has obtained this years flu vaccine.  Allergies as of 04/30/2024   No Known Allergies      Medication List    Airsupra  90-80 MCG/ACT Aero Generic drug: Albuterol -Budesonide  Inhale 2 puffs into the lungs every 4 (four) hours as needed.   albuterol  108 (90 Base) MCG/ACT inhaler Commonly known as: VENTOLIN  HFA Inhale 2 puffs into the lungs every 6 (six) hours as needed. Patient will call when ready to refill   budesonide -formoterol  160-4.5  MCG/ACT inhaler Commonly known as: SYMBICORT  Inhale 2 puffs into the lungs in the morning and at bedtime.   EPINEPHrine  0.3 mg/0.3 mL Soaj injection Commonly known as: EPI-PEN Inject 0.3 mg into the muscle as needed for anaphylaxis.   famotidine  40 MG tablet Commonly known as: PEPCID  Take 1 tablet (40 mg total) by mouth at bedtime.   levocetirizine 5 MG tablet Commonly known as: XYZAL  Take 1 tablet (5 mg total) by mouth daily as needed for allergies (Can take an extar dose during flare ups.).   lisinopril 20 MG tablet Commonly known as: ZESTRIL Take 20 mg by mouth daily.   Methylcellulose (Laxative) 500 MG Tabs Take 2 tablets by mouth daily.   montelukast  10 MG tablet Commonly known as: SINGULAIR  Take 1 tablet (10 mg total) by mouth at bedtime. TAKE 1 TABLET(10 MG) BY MOUTH AT BEDTIME   omeprazole  40 MG capsule Commonly known as: PRILOSEC Take 1 capsule (40 mg total) by mouth in the morning and at bedtime.   OSTEO BI-FLEX ADV JOINT SHIELD PO Take 1 tablet by mouth daily.   PRESCRIPTION MEDICATION ALLERGY  VACCINE -- Use as directed weekly.   Ryaltris  665-25 MCG/ACT Susp Generic drug: Olopatadine-Mometasone Place 1 to 2 sprays in each nostril twice a day for runny nose/drainage down throat/stuffy nose   simvastatin 20 MG tablet Commonly known as: ZOCOR Take 1 tablet (20 mg total) by mouth at bedtime.   Spacer/Aero-Holding [employer] 1 Device  by Does not apply route as directed.    Past Medical History:  Diagnosis Date   ALLERGIC RHINITIS    Asthma    Dyslipidemia    Family history of heart disease    Hepatitis    3rd Grade   History of insomnia    History of nuclear stress test 08/2007    negative bruce myoview   History of palpitations     Past Surgical History:  Procedure Laterality Date   COLONOSCOPY N/A 07/12/2017   Procedure: COLONOSCOPY;  Surgeon: Shaaron Lamar HERO, MD;  [facility];  Service: Endoscopy;  Laterality: N/A;   NASAL  SEPTOPLASTY W/ TURBINOPLASTY  2000   deviated septum   POLYPECTOMY  07/12/2017   Procedure: POLYPECTOMY;  Surgeon: Shaaron Lamar HERO, MD;  [facility];  Service: Endoscopy;;  recto-sigmoid colon   Sleep Study  07/11/2005   AHI during total sleep 3.68/hr, during REM sleep 5.52/hr   TONSILLECTOMY     TRANSTHORACIC ECHOCARDIOGRAM  09/13/2007   EF=>55%, borderline asymmetric LVH; mild MR/TR, normal RVSP; AV mildly sclerotic, trace AV regurg & trave pulm vavle regurg   varicocoel      Review of systems negative except as noted in HPI / PMHx or noted below:  Review of Systems  Constitutional: Negative.   HENT: Negative.    Eyes: Negative.   Respiratory: Negative.    Cardiovascular: Negative.   Gastrointestinal: Negative.   Genitourinary: Negative.   Musculoskeletal: Negative.   Skin: Negative.   Neurological: Negative.   Endo/Heme/Allergies: Negative.   Psychiatric/Behavioral: Negative.       Objective:   Vitals:   04/30/24 0911  BP: 124/78  Pulse: 100  Resp: 16  Temp: 100 F (37.8 C)  SpO2: 99%   Height: 5' 9 (175.3 cm)  Weight: 187 lb 8 oz (85 kg)   Physical Exam Constitutional:      Appearance: He is not diaphoretic.  HENT:     Head: Normocephalic.     Right Ear: Tympanic membrane, ear canal and external ear normal.     Left Ear: Tympanic membrane, ear canal and external ear normal.     Nose: Nose normal. No mucosal edema or rhinorrhea.     Mouth/Throat:     Pharynx: Uvula midline. No oropharyngeal exudate.  Eyes:     Conjunctiva/sclera: Conjunctivae normal.  Neck:     Thyroid: No thyromegaly.     Trachea: Trachea normal. No tracheal tenderness or tracheal deviation.  Cardiovascular:     Rate and Rhythm: Normal rate and regular rhythm.     Heart sounds: Normal heart sounds, S1 normal and S2 normal. No murmur heard. Pulmonary:     Effort: No respiratory distress.     Breath sounds: Normal breath sounds. No stridor. No wheezing or rales.   Lymphadenopathy:     Head:     Right side of head: No tonsillar adenopathy.     Left side of head: No tonsillar adenopathy.     Cervical: No cervical adenopathy.  Skin:    Findings: No erythema or rash.     Nails: There is no clubbing.  Neurological:     Mental Status: He is alert.     Diagnostics: Spirometry was performed and demonstrated an FEV1 of 3.59 at 112 % of predicted.   Assessment and Plan:   1. Asthma, moderate persistent, well-controlled   2. Seasonal allergic rhinitis due to pollen   3. LPRD (laryngopharyngeal reflux disease)    1.  Allergen avoidance measures - dust mite, grass, weed (mask when mowing)  2. Continue to Treat and prevent inflammation:   A. Symbicort  160 - 2 inhalations 1-2 times per day w/spacer.    B. Ryaltris  - 2 sprays each nostril 1-2 times per day  C. Montelukast  10 - 1 tablet 1 time per day  D. Immunotherapy. (Epipen )   3. Continue to Treat and prevent reflux:   A. Omeprazole  40 mg - 2 times per day  B. Famotidine  40 mg in PM IF NEEDED  C. Replace throat clearing with swallowing / drinking maneuver  4.  Continue CPAP for treatment of sleep apnea  5. If needed:   A. AIRSUPRA  - 2 puffs every 4-6 hours    B. nasal saline spray  C. OTC antihistamine  D. Guaifenesin  6. Return to clinic in 6 months or earlier if problem  7. Influenza = Tamiflu. Covid = Paxlovid  Overall Dakari is done pretty well and there may be an opportunity for him to utilize a little less medication.  I asked him to use his Symbicort  on a consistent basis instead of his Airsupra  and he will probably find that he can use Symbicort  just in the morning and that should be adequate to control his asthma.  He will continue to use a combination nasal steroid/antihistamine along with a leukotriene modifier and immunotherapy to address his upper airway disease.  His reflux is going quite well and he can attempt to discontinue his famotidine  while he remains on omeprazole .   Assuming he does well with the plan noted above I will see him back in this clinic in 6 months or earlier if there is a problem.   Camellia Denis, MD Allergy  / Immunology [facility] "

## 2024-05-01 ENCOUNTER — Ambulatory Visit (INDEPENDENT_AMBULATORY_CARE_PROVIDER_SITE_OTHER)

## 2024-05-01 ENCOUNTER — Encounter: Payer: Self-pay | Admitting: Allergy and Immunology

## 2024-05-01 DIAGNOSIS — J302 Other seasonal allergic rhinitis: Secondary | ICD-10-CM

## 2024-05-29 ENCOUNTER — Ambulatory Visit

## 2024-05-29 DIAGNOSIS — J302 Other seasonal allergic rhinitis: Secondary | ICD-10-CM

## 2024-10-29 ENCOUNTER — Ambulatory Visit: Admitting: Allergy and Immunology
